# Patient Record
Sex: Female | Born: 1970 | Race: White | Hispanic: No | Marital: Married | Smoking: Former smoker
Health system: Southern US, Community
[De-identification: ages and names within clinical notes are randomized; demographics above are authoritative.]

## PROBLEM LIST (undated history)

## (undated) DIAGNOSIS — R109 Unspecified abdominal pain: Secondary | ICD-10-CM

## (undated) DIAGNOSIS — D649 Anemia, unspecified: Secondary | ICD-10-CM

## (undated) DIAGNOSIS — T7840XA Allergy, unspecified, initial encounter: Secondary | ICD-10-CM

## (undated) DIAGNOSIS — J45909 Unspecified asthma, uncomplicated: Secondary | ICD-10-CM

## (undated) HISTORY — PX: SHOULDER SURGERY: SHX246

## (undated) HISTORY — DX: Allergy, unspecified, initial encounter: T78.40XA

## (undated) HISTORY — DX: Unspecified asthma, uncomplicated: J45.909

## (undated) HISTORY — PX: ABDOMINAL HYSTERECTOMY: SHX81

## (undated) HISTORY — DX: Anemia, unspecified: D64.9

## (undated) HISTORY — PX: TUBAL LIGATION: SHX77

## (undated) HISTORY — DX: Unspecified abdominal pain: R10.9

---

## 1997-11-05 ENCOUNTER — Other Ambulatory Visit: Admission: RE | Admit: 1997-11-05 | Discharge: 1997-11-05 | Payer: Self-pay | Admitting: Obstetrics and Gynecology

## 1998-05-22 ENCOUNTER — Other Ambulatory Visit: Admission: RE | Admit: 1998-05-22 | Discharge: 1998-05-22 | Payer: Self-pay | Admitting: Obstetrics and Gynecology

## 1998-10-06 ENCOUNTER — Other Ambulatory Visit: Admission: RE | Admit: 1998-10-06 | Discharge: 1998-10-06 | Payer: Self-pay | Admitting: Obstetrics and Gynecology

## 1999-12-06 ENCOUNTER — Other Ambulatory Visit: Admission: RE | Admit: 1999-12-06 | Discharge: 1999-12-06 | Payer: Self-pay | Admitting: Obstetrics and Gynecology

## 2000-07-06 ENCOUNTER — Inpatient Hospital Stay (HOSPITAL_COMMUNITY): Admission: AD | Admit: 2000-07-06 | Discharge: 2000-07-08 | Payer: Self-pay | Admitting: Obstetrics and Gynecology

## 2000-08-10 ENCOUNTER — Other Ambulatory Visit: Admission: RE | Admit: 2000-08-10 | Discharge: 2000-08-10 | Payer: Self-pay | Admitting: Obstetrics and Gynecology

## 2001-08-24 ENCOUNTER — Other Ambulatory Visit: Admission: RE | Admit: 2001-08-24 | Discharge: 2001-08-24 | Payer: Self-pay | Admitting: Obstetrics and Gynecology

## 2002-07-05 ENCOUNTER — Encounter: Admission: RE | Admit: 2002-07-05 | Discharge: 2002-07-05 | Payer: Self-pay | Admitting: Otolaryngology

## 2002-07-05 ENCOUNTER — Encounter: Payer: Self-pay | Admitting: Otolaryngology

## 2002-11-14 ENCOUNTER — Other Ambulatory Visit: Admission: RE | Admit: 2002-11-14 | Discharge: 2002-11-14 | Payer: Self-pay | Admitting: Obstetrics and Gynecology

## 2003-12-03 ENCOUNTER — Other Ambulatory Visit: Admission: RE | Admit: 2003-12-03 | Discharge: 2003-12-03 | Payer: Self-pay | Admitting: Obstetrics and Gynecology

## 2004-02-20 ENCOUNTER — Ambulatory Visit (HOSPITAL_COMMUNITY): Admission: RE | Admit: 2004-02-20 | Discharge: 2004-02-20 | Payer: Self-pay | Admitting: Obstetrics and Gynecology

## 2012-11-14 ENCOUNTER — Encounter (HOSPITAL_COMMUNITY): Payer: Self-pay | Admitting: *Deleted

## 2012-11-14 ENCOUNTER — Emergency Department (HOSPITAL_COMMUNITY)
Admission: EM | Admit: 2012-11-14 | Discharge: 2012-11-14 | Disposition: A | Discharge status: Home / Self Care | Payer: BC Managed Care – PPO | Source: Home / Self Care | Attending: Emergency Medicine | Admitting: Emergency Medicine

## 2012-11-14 DIAGNOSIS — R52 Pain, unspecified: Secondary | ICD-10-CM

## 2012-11-14 DIAGNOSIS — M545 Low back pain: Secondary | ICD-10-CM

## 2012-11-14 DIAGNOSIS — G8929 Other chronic pain: Secondary | ICD-10-CM

## 2012-11-14 MED ORDER — CYCLOBENZAPRINE HCL 10 MG PO TABS: 10.0000 mg | ORAL_TABLET | Freq: Three times a day (TID) | ORAL | Status: DC | PRN

## 2012-11-14 MED ORDER — LORAZEPAM 2 MG/ML IJ SOLN
1.0000 mg | Freq: Once | INTRAMUSCULAR | Status: AC
  Administered 2012-11-14: 1 mg via INTRAMUSCULAR

## 2012-11-14 MED ORDER — TRAMADOL HCL 50 MG PO TABS: 50.0000 mg | ORAL_TABLET | Freq: Four times a day (QID) | ORAL | Status: DC | PRN

## 2012-11-14 MED ORDER — LORAZEPAM 2 MG/ML IJ SOLN
INTRAMUSCULAR | Status: AC
  Filled 2012-11-14: qty 1

## 2012-11-14 MED ORDER — KETOROLAC TROMETHAMINE 60 MG/2ML IM SOLN
60.0000 mg | Freq: Once | INTRAMUSCULAR | Status: AC
  Administered 2012-11-14: 60 mg via INTRAMUSCULAR

## 2012-11-14 MED ORDER — KETOROLAC TROMETHAMINE 60 MG/2ML IM SOLN
INTRAMUSCULAR | Status: AC
  Filled 2012-11-14: qty 2

## 2012-11-14 NOTE — ED Notes (Signed)
Pt      Reports           Low  Back pain  Described  As  A  Spasm          Seen     By  Chiropractor  yest             And  Was  On the  Way  Today  For  A  Repeat  Visit  When the pain    Became  Unbearable  -  She  Reports  Unable  To  Sit  Down       She  denys  Any   Recent  specefic injury      denys  Any  Urinary  Symptoms

## 2012-11-14 NOTE — ED Provider Notes (Signed)
History     CSN: [id number]  Arrival date & time 11/14/12  1128   First MD Initiated Contact with Patient 11/14/12 1345      Chief Complaint  Patient presents with  . Back Pain    (Consider location/radiation/quality/duration/timing/severity/associated sxs/prior treatment) Patient is a 42 y.o. female presenting with back pain. The history is provided by the patient. No language interpreter was used.  Back Pain Location:  Lumbar spine Quality:  Cramping Radiates to:  Does not radiate Pain severity:  Moderate Pain is:  Same all the time Onset quality:  Gradual Timing:  Constant Progression:  Unchanged Chronicity:  Chronic Relieved by:  Nothing Worsened by:  Movement Associated symptoms: no bladder incontinence, no bowel incontinence, no leg pain, no numbness, no paresthesias, no perianal numbness and no weakness   Risk factors comment:  DEGENERATIVE LUMBAR DISC DISEASE   History reviewed. No pertinent past medical history.  History reviewed. No pertinent past surgical history.  No family history on file.  History  Substance Use Topics  . Smoking status: Not on file  . Smokeless tobacco: Not on file  . Alcohol Use: Not on file    OB History   Grav Para Term Preterm Abortions TAB SAB Ect Mult Living                  Review of Systems  Constitutional: Negative.   Gastrointestinal: Negative for bowel incontinence.  Genitourinary: Negative for bladder incontinence.  Musculoskeletal: Positive for back pain.       C/O LOW BACK SPASMS  Neurological: Negative for weakness, numbness and paresthesias.  Psychiatric/Behavioral: Negative.   All other systems reviewed and are negative.    Allergies  Sulfa antibiotics  Home Medications   Current Outpatient Rx  Name  Route  Sig  Dispense  Refill  . cyclobenzaprine (FLEXERIL) 10 MG tablet   Oral   Take 1 tablet (10 mg total) by mouth 3 (three) times daily as needed for muscle spasms.   20 tablet   0   .  traMADol (ULTRAM) 50 MG tablet   Oral   Take 1 tablet (50 mg total) by mouth every 6 (six) hours as needed for pain.   15 tablet   0     BP 148/90  Pulse 115  Temp(Src) 98.4 F (36.9 C) (Oral)  Resp 18  SpO2 99%  Physical Exam  Constitutional: She appears well-developed and well-nourished.  HENT:  Head: Normocephalic and atraumatic.  Eyes: Pupils are equal, round, and reactive to light.  Neck: Normal range of motion.  Cardiovascular: Normal rate and regular rhythm.   Pulmonary/Chest: Effort normal and breath sounds normal.  Abdominal: Soft. Bowel sounds are normal.  Musculoskeletal:  LOW BACK AREA SHOWED NO ERYTHEMA, SWELLING OR DEFORMITY , NO TENDERNESS UNABLE TO PERFORM STRAIGHT LEG EXAM DUE TO BACK SPASMS     ED Course  Procedures (including critical care time)  Labs Reviewed - No data to display No results found.   1. Acute exacerbation of chronic low back pain       MDM          Jani Files, MD 11/15/12 2025

## 2012-11-29 ENCOUNTER — Other Ambulatory Visit: Payer: Self-pay | Admitting: Neurosurgery

## 2012-11-29 DIAGNOSIS — M5416 Radiculopathy, lumbar region: Secondary | ICD-10-CM

## 2012-12-13 ENCOUNTER — Ambulatory Visit
Admission: RE | Admit: 2012-12-13 | Discharge: 2012-12-13 | Disposition: A | Discharge status: Home / Self Care | Payer: BC Managed Care – PPO | Source: Ambulatory Visit | Attending: Neurosurgery | Admitting: Neurosurgery

## 2012-12-13 DIAGNOSIS — M5416 Radiculopathy, lumbar region: Secondary | ICD-10-CM

## 2017-02-28 DIAGNOSIS — M222X2 Patellofemoral disorders, left knee: Secondary | ICD-10-CM | POA: Insufficient documentation

## 2017-02-28 DIAGNOSIS — M25562 Pain in left knee: Secondary | ICD-10-CM | POA: Insufficient documentation

## 2017-04-25 DIAGNOSIS — M25512 Pain in left shoulder: Secondary | ICD-10-CM | POA: Insufficient documentation

## 2017-05-17 ENCOUNTER — Ambulatory Visit (INDEPENDENT_AMBULATORY_CARE_PROVIDER_SITE_OTHER): Payer: BLUE CROSS/BLUE SHIELD | Admitting: Physician Assistant

## 2017-05-17 ENCOUNTER — Encounter: Payer: Self-pay | Admitting: Physician Assistant

## 2017-05-17 VITALS — BP 112/80 | HR 73 | Temp 98.4°F | Resp 16 | Ht 68.75 in | Wt 167.8 lb

## 2017-05-17 DIAGNOSIS — H6981 Other specified disorders of Eustachian tube, right ear: Secondary | ICD-10-CM | POA: Diagnosis not present

## 2017-05-17 DIAGNOSIS — H9201 Otalgia, right ear: Secondary | ICD-10-CM

## 2017-05-17 DIAGNOSIS — H6991 Unspecified Eustachian tube disorder, right ear: Secondary | ICD-10-CM

## 2017-05-17 NOTE — Progress Notes (Signed)
   Shaune PascalKelly Pepitone  MRN: [id number] DOB: 1970/12/25  PCP: Patient, No Pcp Per  Subjective:  Pt is a pleasant 46 year old female who presents to clinic for right ear ache x 2 days.  Endorses intermittent right ear pain. Pain is "shooting". Feels better when she pulls it back a little and it "pops". Endorses some ringing in her ears. She has not taken anything.   Denies drainage, decreased hearing, fever, chills, cough. She had a cold a few weeks ago.   Review of Systems  Constitutional: Negative for chills and fever.  HENT: Positive for ear pain and tinnitus. Negative for congestion, ear discharge, rhinorrhea and sinus pain.   Respiratory: Negative for cough.     There are no active problems to display for this patient.   Current Outpatient Prescriptions on File Prior to Visit  Medication Sig Dispense Refill  . cyclobenzaprine (FLEXERIL) 10 MG tablet Take 1 tablet (10 mg total) by mouth 3 (three) times daily as needed for muscle spasms. (Patient not taking: Reported on 05/17/2017) 20 tablet 0  . traMADol (ULTRAM) 50 MG tablet Take 1 tablet (50 mg total) by mouth every 6 (six) hours as needed for pain. (Patient not taking: Reported on 05/17/2017) 15 tablet 0   No current facility-administered medications on file prior to visit.     Allergies  Allergen Reactions  . Penicillins     rash  . Sulfa Antibiotics      Objective:  BP 112/80   Pulse 73   Temp 98.4 F (36.9 C) (Oral)   Resp 16   Ht 5' 8.75" (1.746 m)   Wt 167 lb 12.8 oz (76.1 kg)   LMP 04/29/2017   SpO2 97%   BMI 24.96 kg/m   Physical Exam  Constitutional: She is oriented to person, place, and time and well-developed, well-nourished, and in no distress. No distress.  HENT:  Right Ear: No tenderness. No mastoid tenderness. Tympanic membrane is bulging. No middle ear effusion.  Left Ear: Tympanic membrane normal. No tenderness. No mastoid tenderness.  No middle ear effusion.  Mouth/Throat: Oropharynx is clear and  moist and mucous membranes are normal.  Neurological: She is alert and oriented to person, place, and time. GCS score is 15.  Skin: Skin is warm and dry.  Psychiatric: Mood, memory, affect and judgment normal.  Vitals reviewed.   Assessment and Plan :  1. Dysfunction of right eustachian tube 2. Right ear pain - Suspect pain 2/2 dysfunction of eustachian tube. Advised claritin-D and flonase. RTC if no improvement in 5-7 days.    Marco CollieWhitney Papa [place], PA-C  [facility] 05/17/2017 11:21 AM

## 2017-05-17 NOTE — Patient Instructions (Signed)
     IF you received an x-ray today, you will receive an invoice from [facility]. Please contact [facility] at [phone] with questions or concerns regarding your invoice.   IF you received labwork today, you will receive an invoice from [facility]. Please contact [facility] at [phone] with questions or concerns regarding your invoice.   Our billing staff will not be able to assist you with questions regarding bills from these companies.  You will be contacted with the lab results as soon as they are available. The fastest way to get your results is to activate your My Chart account. Instructions are located on the last page of this paperwork. If you have not heard from us regarding the results in 2 weeks, please contact this office.     

## 2017-06-01 ENCOUNTER — Other Ambulatory Visit: Payer: Self-pay | Admitting: Orthopedic Surgery

## 2017-06-01 DIAGNOSIS — M25512 Pain in left shoulder: Secondary | ICD-10-CM

## 2017-06-13 ENCOUNTER — Ambulatory Visit
Admission: RE | Admit: 2017-06-13 | Discharge: 2017-06-13 | Disposition: A | Discharge status: Home / Self Care | Payer: BLUE CROSS/BLUE SHIELD | Source: Ambulatory Visit | Attending: Orthopedic Surgery | Admitting: Orthopedic Surgery

## 2017-06-13 DIAGNOSIS — M25512 Pain in left shoulder: Secondary | ICD-10-CM

## 2017-07-04 DIAGNOSIS — Z9889 Other specified postprocedural states: Secondary | ICD-10-CM | POA: Insufficient documentation

## 2017-08-07 ENCOUNTER — Other Ambulatory Visit (HOSPITAL_COMMUNITY): Payer: Self-pay | Admitting: *Deleted

## 2017-08-08 ENCOUNTER — Encounter (HOSPITAL_COMMUNITY)
Admission: RE | Admit: 2017-08-08 | Discharge: 2017-08-08 | Disposition: A | Discharge status: Home / Self Care | Payer: BLUE CROSS/BLUE SHIELD | Source: Ambulatory Visit | Attending: Obstetrics and Gynecology | Admitting: Obstetrics and Gynecology

## 2017-08-08 DIAGNOSIS — D649 Anemia, unspecified: Secondary | ICD-10-CM | POA: Insufficient documentation

## 2017-08-08 MED ORDER — SODIUM CHLORIDE 0.9 % IV SOLN
510.0000 mg | INTRAVENOUS | Status: DC
  Administered 2017-08-08: 11:00:00 510 mg via INTRAVENOUS
  Filled 2017-08-08: qty 17

## 2017-08-08 NOTE — Discharge Instructions (Signed)

## 2017-08-14 ENCOUNTER — Encounter (HOSPITAL_COMMUNITY)
Admission: RE | Admit: 2017-08-14 | Discharge: 2017-08-14 | Disposition: A | Discharge status: Home / Self Care | Payer: BLUE CROSS/BLUE SHIELD | Source: Ambulatory Visit | Attending: Obstetrics and Gynecology | Admitting: Obstetrics and Gynecology

## 2017-08-14 DIAGNOSIS — D649 Anemia, unspecified: Secondary | ICD-10-CM | POA: Insufficient documentation

## 2017-08-14 MED ORDER — SODIUM CHLORIDE 0.9 % IV SOLN
510.0000 mg | INTRAVENOUS | Status: DC
  Administered 2017-08-14: 510 mg via INTRAVENOUS
  Filled 2017-08-14: qty 17

## 2017-11-25 ENCOUNTER — Other Ambulatory Visit: Payer: Self-pay

## 2017-11-25 ENCOUNTER — Emergency Department (HOSPITAL_COMMUNITY)
Admission: EM | Admit: 2017-11-25 | Discharge: 2017-11-25 | Disposition: A | Discharge status: Home / Self Care | Payer: BLUE CROSS/BLUE SHIELD | Attending: Emergency Medicine | Admitting: Emergency Medicine

## 2017-11-25 ENCOUNTER — Emergency Department (HOSPITAL_COMMUNITY): Payer: BLUE CROSS/BLUE SHIELD

## 2017-11-25 ENCOUNTER — Encounter (HOSPITAL_COMMUNITY): Payer: Self-pay | Admitting: *Deleted

## 2017-11-25 DIAGNOSIS — R110 Nausea: Secondary | ICD-10-CM | POA: Insufficient documentation

## 2017-11-25 DIAGNOSIS — R51 Headache: Secondary | ICD-10-CM | POA: Diagnosis present

## 2017-11-25 DIAGNOSIS — Z87891 Personal history of nicotine dependence: Secondary | ICD-10-CM | POA: Insufficient documentation

## 2017-11-25 DIAGNOSIS — I10 Essential (primary) hypertension: Secondary | ICD-10-CM | POA: Diagnosis not present

## 2017-11-25 DIAGNOSIS — R519 Headache, unspecified: Secondary | ICD-10-CM

## 2017-11-25 LAB — COMPREHENSIVE METABOLIC PANEL
ALK PHOS: 43 U/L (ref 38–126)
ALT: 18 U/L (ref 14–54)
AST: 27 U/L (ref 15–41)
Albumin: 4.2 g/dL (ref 3.5–5.0)
Anion gap: 8 (ref 5–15)
BILIRUBIN TOTAL: 0.7 mg/dL (ref 0.3–1.2)
BUN: 11 mg/dL (ref 6–20)
CALCIUM: 9.7 mg/dL (ref 8.9–10.3)
CO2: 26 mmol/L (ref 22–32)
Chloride: 106 mmol/L (ref 101–111)
Creatinine, Ser: 1.03 mg/dL — ABNORMAL HIGH (ref 0.44–1.00)
GFR calc Af Amer: >60 mL/min (ref >60)
GFR calc non Af Amer: >60 mL/min (ref >60)
GLUCOSE: 95 mg/dL (ref 65–99)
Potassium: 4.4 mmol/L (ref 3.5–5.1)
Sodium: 140 mmol/L (ref 135–145)
Total Protein: 7 g/dL (ref 6.5–8.1)

## 2017-11-25 LAB — CBC
HEMATOCRIT: 43 % (ref 36.0–46.0)
Hemoglobin: 14.2 g/dL (ref 12.0–15.0)
MCH: 31.8 pg (ref 26.0–34.0)
MCHC: 33 g/dL (ref 30.0–36.0)
MCV: 96.4 fL (ref 78.0–100.0)
Platelets: 248 10*3/uL (ref 150–400)
RBC: 4.46 MIL/uL (ref 3.87–5.11)
RDW: 13.5 % (ref 11.5–15.5)
WBC: 6.4 10*3/uL (ref 4.0–10.5)

## 2017-11-25 LAB — I-STAT CHEM 8, ED
BUN: 12 mg/dL (ref 6–20)
CHLORIDE: 105 mmol/L (ref 101–111)
CREATININE: 1 mg/dL (ref 0.44–1.00)
Calcium, Ion: 1.21 mmol/L (ref 1.15–1.40)
GLUCOSE: 95 mg/dL (ref 65–99)
HCT: 45 % (ref 36.0–46.0)
Hemoglobin: 15.3 g/dL — ABNORMAL HIGH (ref 12.0–15.0)
POTASSIUM: 4.9 mmol/L (ref 3.5–5.1)
Sodium: 141 mmol/L (ref 135–145)
TCO2: 25 mmol/L (ref 22–32)

## 2017-11-25 LAB — DIFFERENTIAL
BASOS PCT: 1 %
Basophils Absolute: 0.1 10*3/uL (ref 0.0–0.1)
EOS PCT: 3 %
Eosinophils Absolute: 0.2 10*3/uL (ref 0.0–0.7)
LYMPHS PCT: 40 %
Lymphs Abs: 2.6 10*3/uL (ref 0.7–4.0)
MONO ABS: 0.3 10*3/uL (ref 0.1–1.0)
Monocytes Relative: 4 %
Neutro Abs: 3.2 10*3/uL (ref 1.7–7.7)
Neutrophils Relative %: 52 %

## 2017-11-25 LAB — I-STAT BETA HCG BLOOD, ED (MC, WL, AP ONLY): I-stat hCG, quantitative: <5 m[IU]/mL (ref <5)

## 2017-11-25 LAB — PROTIME-INR
INR: 1.04
Prothrombin Time: 13.5 seconds (ref 11.4–15.2)

## 2017-11-25 LAB — APTT: aPTT: 31 seconds (ref 24–36)

## 2017-11-25 LAB — I-STAT TROPONIN, ED: Troponin i, poc: 0 ng/mL (ref 0.00–0.08)

## 2017-11-25 MED ORDER — METOCLOPRAMIDE HCL 5 MG/ML IJ SOLN
10.0000 mg | Freq: Once | INTRAMUSCULAR | Status: AC
  Administered 2017-11-25: 10 mg via INTRAVENOUS
  Filled 2017-11-25: qty 2

## 2017-11-25 MED ORDER — SODIUM CHLORIDE 0.9 % IV SOLN: INTRAVENOUS | Status: DC

## 2017-11-25 MED ORDER — KETOROLAC TROMETHAMINE 10 MG PO TABS: 10.0000 mg | ORAL_TABLET | Freq: Four times a day (QID) | ORAL | 0 refills | Status: DC | PRN

## 2017-11-25 MED ORDER — DIPHENHYDRAMINE HCL 50 MG/ML IJ SOLN
12.5000 mg | Freq: Once | INTRAMUSCULAR | Status: AC
  Administered 2017-11-25: 12.5 mg via INTRAVENOUS
  Filled 2017-11-25: qty 1

## 2017-11-25 MED ORDER — ONDANSETRON 4 MG PO TBDP: 4.0000 mg | ORAL_TABLET | Freq: Three times a day (TID) | ORAL | 0 refills | Status: DC | PRN

## 2017-11-25 MED ORDER — SODIUM CHLORIDE 0.9 % IV BOLUS
1000.0000 mL | Freq: Once | INTRAVENOUS | Status: AC
  Administered 2017-11-25: 1000 mL via INTRAVENOUS

## 2017-11-25 MED ORDER — KETOROLAC TROMETHAMINE 15 MG/ML IJ SOLN
15.0000 mg | Freq: Once | INTRAMUSCULAR | Status: AC
  Administered 2017-11-25: 15 mg via INTRAVENOUS
  Filled 2017-11-25: qty 1

## 2017-11-25 NOTE — ED Triage Notes (Addendum)
Pt is here with a headache for the last 2 weeks and states when her blood pressure spikes she gets an immediate headache with nausea.  No history of migraines.  She also states she blew a blood vessel in her right eye.  No change in vision.  Pt states when the pain first hits the pain is so bad she could rip her head off. Pt states pain feels like a vice and comes up off the back of her neck and radiates up.  Now it is in her temples.  Pupils 3RRB.

## 2017-11-25 NOTE — ED Provider Notes (Signed)
[facility] EMERGENCY DEPARTMENT Provider Note   CSN: [id number] Arrival date & time: 11/25/17  6213     History   Chief Complaint Chief Complaint  Patient presents with  . Headache    HPI Michelle Dorsey is a 47 y.o. female.  Pt presents to the ED today with a headache for the past 2 weeks.  She said she started developing a spontaneous severe headache out of the blue 2 weeks ago.  It will spike and cause severe and intense pain.  It is also associated with an elevated bp. Pt said ha will eventually go away, but it takes awhile and never fully goes away.  It is coming back more frequently.  She said she gets nauseous as well.  She has never had migraines or htn.  She did have a hysterectomy about 6 weeks ago, but her ovaries are still in place.     Past Medical History:  Diagnosis Date  . Anemia     Patient Active Problem List   Diagnosis Date Noted  . 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 ABDOMINAL HYSTERECTOMY    . SHOULDER SURGERY    . TUBAL LIGATION       OB History   None      Home Medications    Prior to Admission medications   Medication Sig Start Date End Date Taking? Authorizing Provider  acetaminophen (TYLENOL) 500 MG tablet Take 500 mg by mouth every 6 (six) hours as needed for headache.   Yes [provider]  ibuprofen (ADVIL,MOTRIN) 200 MG tablet Take 200 mg by mouth every 6 (six) hours as needed for headache.   Yes [provider]  cyclobenzaprine (FLEXERIL) 10 MG tablet Take 1 tablet (10 mg total) by mouth 3 (three) times daily as needed for muscle spasms. Patient not taking: Reported on 05/17/2017 11/14/12   [place], Duwayne Heck, MD  ketorolac (TORADOL) 10 MG tablet Take 1 tablet (10 mg total) by mouth every 6 (six) hours as needed. 11/25/17   Jacalyn Lefevre, MD  ondansetron (ZOFRAN ODT) 4 MG disintegrating tablet Take 1 tablet (4 mg total)  by mouth every 8 (eight) hours as needed. 11/25/17   Jacalyn Lefevre, MD  traMADol (ULTRAM) 50 MG tablet Take 1 tablet (50 mg total) by mouth every 6 (six) hours as needed for pain. Patient not taking: Reported on 05/17/2017 11/14/12   [place], Duwayne Heck, MD    Family History Family History  Problem Relation Age of Onset  . Heart disease Mother   . Cancer Maternal Grandfather        bladder  . Cancer Paternal Grandfather        Lung    Social History Social History   Tobacco Use  . Smoking status: Former Games developer  . Smokeless tobacco: Never Used  Substance Use Topics  . Alcohol use: No  . Drug use: No     Allergies   Penicillins and Sulfa antibiotics   Review of Systems Review of Systems  Gastrointestinal: Positive for nausea.  Neurological: Positive for headaches.  All other systems reviewed and are negative.    Physical Exam Updated Vital Signs BP (!) 148/99   Pulse 79   Resp (!) 21   Ht  (1.753 m)   Wt 76.7 kg (169 lb)   LMP 04/29/2017   SpO2 99%   BMI 24.96 kg/m  Physical Exam  Constitutional: She is oriented to person, place, and time. She appears well-developed and well-nourished.  HENT:  Head: Normocephalic and atraumatic.  Mouth/Throat: Oropharynx is clear and moist.  Eyes: Pupils are equal, round, and reactive to light. EOM are normal.  Neck: Normal range of motion. Neck supple.  Cardiovascular: Normal rate, regular rhythm, normal heart sounds and intact distal pulses.  Pulmonary/Chest: Effort normal and breath sounds normal.  Abdominal: Soft. Bowel sounds are normal.  Musculoskeletal: Normal range of motion.  Neurological: She is alert and oriented to person, place, and time. She has normal strength. She displays a negative Romberg sign.  Skin: Skin is warm and dry. Capillary refill takes less than 2 seconds.  Psychiatric: She has a normal mood and affect. Her behavior is normal.  Nursing note and vitals reviewed.    ED Treatments /  Results  Labs (all labs ordered are listed, but only abnormal results are displayed) Labs Reviewed  COMPREHENSIVE METABOLIC PANEL - Abnormal; Notable for the following components:      Result Value   Creatinine, Ser 1.03 (*)    All other components within normal limits  I-STAT CHEM 8, ED - Abnormal; Notable for the following components:   Hemoglobin 15.3 (*)    All other components within normal limits  PROTIME-INR  APTT  CBC  DIFFERENTIAL  I-STAT TROPONIN, ED  CBG MONITORING, ED  I-STAT BETA HCG BLOOD, ED ([facility], [facility], [facility] ONLY)    EKG EKG Interpretation  Date/Time:  Saturday Nov 25 2017 12:11:28 EDT Ventricular Rate:  59 PR Interval:    QRS Duration: 106 QT Interval:  441 QTC Calculation: 437 R Axis:   70 Text Interpretation:  Sinus rhythm Baseline wander in lead(s) V1 V2 Confirmed by Jacalyn Lefevre [phone]) on 11/25/2017 12:28:55 PM   Radiology Ct Head Wo Contrast  Result Date: 11/25/2017 CLINICAL DATA:  Intermittent headache for the past 2 weeks, severe last evening EXAM: CT HEAD WITHOUT CONTRAST TECHNIQUE: Contiguous axial images were obtained from the base of the skull through the vertex without intravenous contrast. COMPARISON:  None. FINDINGS: Brain: Gray-white differentiation is maintained. No CT evidence of acute large territory infarct. No intraparenchymal or extra-axial mass or hemorrhage. Normal size and configuration of the ventricles and the basilar cisterns. No midline shift. Vascular: No hyperdense vessel or unexpected calcification. Skull: No displaced calvarial fracture. Sinuses/Orbits: Circumferential mucosal thickening involving the caudal aspects of the bilateral maxillary sinuses, right greater than left. Remaining paranasal sinuses and mastoid air cells appear normally aerated. No air-fluid levels. Other: Regional soft tissues appear normal. IMPRESSION: Negative noncontrast head CT. Electronically Signed   By: Simonne Come M.D.   On: 11/25/2017 11:32     Procedures Procedures (including critical care time)  Medications Ordered in ED Medications  sodium chloride 0.9 % bolus 1,000 mL (1,000 mLs Intravenous New Bag/Given 11/25/17 1235)    And  0.9 %  sodium chloride infusion (has no administration in time range)  ketorolac (TORADOL) 15 MG/ML injection 15 mg (15 mg Intravenous Given 11/25/17 1232)  metoCLOPramide (REGLAN) injection 10 mg (10 mg Intravenous Given 11/25/17 1234)  diphenhydrAMINE (BENADRYL) injection 12.5 mg (12.5 mg Intravenous Given 11/25/17 1233)     Initial Impression / Assessment and Plan / ED Course  I have reviewed the triage vital signs and the nursing notes.  Pertinent labs & imaging results that were available during my care of the patient were reviewed by me and considered in my medical decision making (see chart for  details).    Pt is feeling much better.  She looks much more comfortable.  BP is trending down with pain relief.  She will be d/c home with instructions to f/u with neurology.  Return if worse.  Final Clinical Impressions(s) / ED Diagnoses   Final diagnoses:  Headache disorder    ED Discharge Orders        Ordered    ketorolac (TORADOL) 10 MG tablet  Every 6 hours PRN     11/25/17 1424    ondansetron (ZOFRAN ODT) 4 MG disintegrating tablet  Every 8 hours PRN     11/25/17 1424       Jacalyn Lefevre, MD 11/25/17 1426

## 2017-12-15 ENCOUNTER — Ambulatory Visit: Payer: BLUE CROSS/BLUE SHIELD | Admitting: Neurology

## 2017-12-15 ENCOUNTER — Encounter: Payer: Self-pay | Admitting: Neurology

## 2017-12-15 VITALS — BP 118/81 | HR 68 | Ht 69.0 in | Wt 169.0 lb

## 2017-12-15 DIAGNOSIS — R519 Headache, unspecified: Secondary | ICD-10-CM

## 2017-12-15 DIAGNOSIS — G43009 Migraine without aura, not intractable, without status migrainosus: Secondary | ICD-10-CM | POA: Diagnosis not present

## 2017-12-15 DIAGNOSIS — R51 Headache: Secondary | ICD-10-CM

## 2017-12-15 DIAGNOSIS — R1033 Periumbilical pain: Secondary | ICD-10-CM | POA: Diagnosis not present

## 2017-12-15 DIAGNOSIS — I609 Nontraumatic subarachnoid hemorrhage, unspecified: Secondary | ICD-10-CM

## 2017-12-15 DIAGNOSIS — G4484 Primary exertional headache: Secondary | ICD-10-CM

## 2017-12-15 DIAGNOSIS — Q283 Other malformations of cerebral vessels: Secondary | ICD-10-CM | POA: Diagnosis not present

## 2017-12-15 DIAGNOSIS — H44811 Hemophthalmos, right eye: Secondary | ICD-10-CM

## 2017-12-15 DIAGNOSIS — G4453 Primary thunderclap headache: Secondary | ICD-10-CM

## 2017-12-15 MED ORDER — KETOROLAC TROMETHAMINE 10 MG PO TABS: 10.0000 mg | ORAL_TABLET | Freq: Four times a day (QID) | ORAL | 11 refills | Status: DC | PRN

## 2017-12-15 NOTE — Progress Notes (Signed)
[facility]    Provider:  Dr Lucia Gaskins Referring Provider: [facility] Dr. Harold Hedge Primary Care Physician:  [facility] Dr. Harold Hedge  CC:  Headaches  HPI:  Michelle Dorsey is a 47 y.o. female here as a referral from Dr. No ref. provider found for headaches. She has no significant history, no history of migraines. She has abdominal pain, can last hours, helps to defecate, causes her blood pressure to increase. She has "stomach attacks" and not over until she emties her bowels. Right before she orgasms she has a headache it comes and goes. With stress she has the abdominal pain and "stomach attacks" and her blood presure "goes trhough the roof" and that is when she has the headache. Don;t always have a headache with the abdominal pain only when severe and her blood pressure increases. She has never been to GI. Ketorolac works great for the abdominal pain and the headache. The headache is all over, pounding, nausea.No other focal neurologic deficits, associated symptoms, inciting events or modifiable factors.  Reviewed notes, labs and imaging from outside physicians, which showed:  BUN 12, creatinine 1  CT head showed No acute intracranial abnormalities including mass lesion or mass effect, hydrocephalus, extra-axial fluid collection, midline shift, hemorrhage, or acute infarction, large ischemic events (personally reviewed images)   Review of Systems: Patient complains of symptoms per HPI as well as the following symptoms: headache, abdominal pain. Pertinent negatives and positives per HPI. All others negative.   Social History   Socioeconomic History  . Marital status: Single    Spouse name: Not on file  . Number of children: Not on file  . Years of education: Not on file  . Highest education level: Not on file  Occupational History  . Not on file  Social Needs  . Financial resource strain: Not on file  . Food insecurity:    Worry: Not on  file    Inability: Not on file  . Transportation needs:    Medical: Not on file    Non-medical: Not on file  Tobacco Use  . Smoking status: Former Smoker    Last attempt to quit: 2014    Years since quitting: 5.4  . Smokeless tobacco: Never Used  Substance and Sexual Activity  . Alcohol use: No  . Drug use: No  . Sexual activity: Not on file  Lifestyle  . Physical activity:    Days per week: Not on file    Minutes per session: Not on file  . Stress: Not on file  Relationships  . Social connections:    Talks on phone: Not on file    Gets together: Not on file    Attends religious service: Not on file    Active member of club or organization: Not on file    Attends meetings of clubs or organizations: Not on file    Relationship status: Not on file  . Intimate partner violence:    Fear of current or ex partner: Not on file    Emotionally abused: Not on file    Physically abused: Not on file    Forced sexual activity: Not on file  Other Topics Concern  . Not on file  Social History Narrative  . Not on file    Family History  Problem Relation Age of Onset  . Heart disease Mother   . Recurrent abdominal pain Mother   . Cancer Maternal Grandfather        bladder  .  Cancer Paternal Grandfather        Lung    Past Medical History:  Diagnosis Date  . Abdominal pain   . Anemia     Past Surgical History:  Procedure Laterality Date  . ABDOMINAL HYSTERECTOMY    . SHOULDER SURGERY    . TUBAL LIGATION      Current Outpatient Medications  Medication Sig Dispense Refill  . ketorolac (TORADOL) 10 MG tablet Take 1 tablet (10 mg total) by mouth every 6 (six) hours as needed for severe pain. 30 tablet 11   No current facility-administered medications for this visit.     Allergies as of 12/15/2017 - Review Complete 12/15/2017  Allergen Reaction Noted  . Penicillins  05/17/2017  . Sulfa antibiotics  11/14/2012    Vitals: BP 118/81   Pulse 68   Ht  (1.753 m)    Wt 169 lb (76.7 kg)   LMP 04/29/2017   BMI 24.96 kg/m  Last Weight:  Wt Readings from Last 1 Encounters:  12/15/17 169 lb (76.7 kg)   Last Height:   Ht Readings from Last 1 Encounters:  12/15/17  (1.753 m)   Physical exam: Exam: Gen: NAD, conversant, well nourised, obese, well groomed                     CV: RRR, no MRG. No Carotid Bruits. No peripheral edema, warm, nontender Eyes: Conjunctivae clear without exudates or hemorrhage  Neuro: Detailed Neurologic Exam  Speech:    Speech is normal; fluent and spontaneous with normal comprehension.  Cognition:    The patient is oriented to person, place, and time;     recent and remote memory intact;     language fluent;     normal attention, concentration,     fund of knowledge Cranial Nerves:    The pupils are equal, round, and reactive to light. The fundi are normal and spontaneous venous pulsations are present. Visual fields are full to finger confrontation. Extraocular movements are intact. Trigeminal sensation is intact and the muscles of mastication are normal. The face is symmetric. The palate elevates in the midline. Hearing intact. Voice is normal. Shoulder shrug is normal. The tongue has normal motion without fasciculations.   Coordination:    Normal finger to nose and heel to shin. Normal rapid alternating movements.   Gait:    Heel-toe and tandem gait are normal.   Motor Observation:    No asymmetry, no atrophy, and no involuntary movements noted. Tone:    Normal muscle tone.    Posture:    Posture is normal. normal erect    Strength:    Strength is V/V in the upper and lower limbs.      Sensation: intact to LT     Reflex Exam:  DTR's:    Deep tendon reflexes in the upper and lower extremities are normal bilaterally.   Toes:    The toes are downgoing bilaterally.   Clonus:    Clonus is absent.       Assessment/Plan:  Patient with explosive exertional headaches, worse headache in her life,  thunderclap. Likely exertional headaches and also with migraines and episodes of abdominal pain "excruciating"   [facility] Dr. Harold Hedge for regular care.   "abdominal attacks" severe and painful, incapacitating - will refer to GI. Could be migrainous or abdominal migraines but needs evaluation due to severity  Explosive exertional headaches: Need evaluation for subarachnoid hemorrhage, aneurysm, lesions in the  brain such as vascular malformations: MRI brain w/wo contrast and MRA head  Can try taking Ketorolac 30-60 minutes before sex. Can also try indomethacin in the future if needed or propranolol .   Ketorolac helps,   Orders Placed This Encounter  Procedures  . MR BRAIN W WO CONTRAST  . MR MRA HEAD WO CONTRAST  . Ambulatory referral to Gastroenterology   Cc: Dr. Josefa Half, MD  [facility] [address] [place], [place] [address]  Phone [phone] Fax [phone]

## 2017-12-15 NOTE — Patient Instructions (Signed)
Gastroenterology referral MRI of the brain and blood vessels Torolac as needed or 30-60 minutes before sex do not take more than 2-3x in one week  Migraine Headache A migraine headache is a very strong throbbing pain on one side or both sides of your head. Migraines can also cause other symptoms. Talk with your doctor about what things may bring on (trigger) your migraine headaches. Follow these instructions at home: Medicines  Take over-the-counter and prescription medicines only as told by your doctor.  Do not drive or use heavy machinery while taking prescription pain medicine.  To prevent or treat constipation while you are taking prescription pain medicine, your doctor may recommend that you: ? Drink enough fluid to keep your pee (urine) clear or pale yellow. ? Take over-the-counter or prescription medicines. ? Eat foods that are high in fiber. These include fresh fruits and vegetables, whole grains, and beans. ? Limit foods that are high in fat and processed sugars. These include fried and sweet foods. Lifestyle  Avoid alcohol.  Do not use any products that contain nicotine or tobacco, such as cigarettes and e-cigarettes. If you need help quitting, ask your doctor.  Get at least 8 hours of sleep every night.  Limit your stress. General instructions   Keep a journal to find out what may bring on your migraines. For example, write down: ? What you eat and drink. ? How much sleep you get. ? Any change in what you eat or drink. ? Any change in your medicines.  If you have a migraine: ? Avoid things that make your symptoms worse, such as bright lights. ? It may help to lie down in a dark, quiet room. ? Do not drive or use heavy machinery. ? Ask your doctor what activities are safe for you.  Keep all follow-up visits as told by your doctor. This is important. Contact a doctor if:  You get a migraine that is different or worse than your usual migraines. Get help right  away if:  Your migraine gets very bad.  You have a fever.  You have a stiff neck.  You have trouble seeing.  Your muscles feel weak or like you cannot control them.  You start to lose your balance a lot.  You start to have trouble walking.  You pass out (faint). This information is not intended to replace advice given to you by your health care provider. Make sure you discuss any questions you have with your health care provider. Document Released: 04/12/2008 Document Revised: 01/22/2016 Document Reviewed: 12/21/2015 Elsevier Interactive Patient Education  2018 [employer].

## 2017-12-18 ENCOUNTER — Telehealth: Payer: Self-pay | Admitting: Neurology

## 2017-12-18 NOTE — Telephone Encounter (Signed)
pt requested [facility] [employer] Auth: NPR Ref # [id number] order sent to GI. They will contact the pt to schedule.

## 2018-01-04 ENCOUNTER — Telehealth: Payer: Self-pay | Admitting: Neurology

## 2018-01-04 NOTE — Telephone Encounter (Signed)
I called [facility], spoke with Maureen RalphsVivian, pharmacist. The manufacturer of toradol does not recommend more than 20 tablets per 30 days. Also, refills are not recommended. Pt should receive IV or IM toradol injection immediately prior to dispensing toradol tablets as well. [facility] will not give her any more toradol tablets, and says that another NSAID will need to be prescribed. Dr. Lucia GaskinsAhern is out of the office. Will send WID for review.

## 2018-01-04 NOTE — Telephone Encounter (Signed)
I called pt and discuss Dr. Teofilo PodAthar's recommendations. Pt is agreeable to completing her MRI/MRA and asked that our office reach out to her to get those scheduled. Pt asked if another medication could be prescribed, similar to toradol, since the toradol helped her headaches. I advised pt that this would need to be addressed with Dr. Lucia GaskinsAhern, upon her return to the office, since Dr. Frances FurbishAthar does not recommend anything right now. Pt verbalized understanding. Will send to Dr. Lucia GaskinsAhern to review upon her return.  I called [facility], advised staff that pt's toradol RX should be placed on hold, they verbalized understanding.

## 2018-01-04 NOTE — Telephone Encounter (Signed)
Pt called to advise [facility] will not give her ketorolac (TORADOL) 10 MG tablet, cannot allow that many refills and that many pills at all and pt will need to have a steroid shot. She is asking for a call to be made to pharmacy for clarification.

## 2018-01-04 NOTE — Telephone Encounter (Signed)
I agree that ongoing use of Toradol is not recommended. I would suggest we await test results from the MRI and MRA, please look into when these are scheduled for. I would not recommend any other high-dose nonsteroidal pain medication especially in light of stomach pains and abdominal pain reported. She may need to see GI as recommended by Dr. Lucia GaskinsAhern. Please clarify with pharmacy that for now we will put the refills on the Toradol on hold. Please also notify patient of the above.

## 2018-01-09 ENCOUNTER — Other Ambulatory Visit: Payer: Self-pay | Admitting: Neurology

## 2018-01-09 NOTE — Telephone Encounter (Signed)
We discussed trying 10mg  propranol 30 minutes before sex. Let's wait until the imaging results and if negative we can try that. Indomethacin is also something we can try but I would want her to see GI for her reported severe stomach pain first thanks

## 2018-01-10 NOTE — Telephone Encounter (Signed)
Called pt & LVM (ok per DPR) with the information just provided by Dr. Lucia GaskinsAhern in response to her request for a different medication to try. Left office number for any questions.

## 2018-01-19 ENCOUNTER — Inpatient Hospital Stay: Admission: RE | Admit: 2018-01-19 | Payer: BLUE CROSS/BLUE SHIELD | Source: Ambulatory Visit

## 2018-01-19 ENCOUNTER — Other Ambulatory Visit: Payer: BLUE CROSS/BLUE SHIELD

## 2018-03-21 ENCOUNTER — Other Ambulatory Visit (INDEPENDENT_AMBULATORY_CARE_PROVIDER_SITE_OTHER): Payer: BLUE CROSS/BLUE SHIELD

## 2018-03-21 ENCOUNTER — Encounter (INDEPENDENT_AMBULATORY_CARE_PROVIDER_SITE_OTHER): Payer: Self-pay

## 2018-03-21 ENCOUNTER — Encounter: Payer: Self-pay | Admitting: Gastroenterology

## 2018-03-21 ENCOUNTER — Ambulatory Visit: Payer: BLUE CROSS/BLUE SHIELD | Admitting: Gastroenterology

## 2018-03-21 VITALS — BP 116/80 | HR 72 | Ht 69.0 in | Wt 159.4 lb

## 2018-03-21 DIAGNOSIS — R109 Unspecified abdominal pain: Secondary | ICD-10-CM

## 2018-03-21 LAB — SEDIMENTATION RATE: SED RATE: 14 mm/h (ref 0–20)

## 2018-03-21 LAB — IGA: IgA: 206 mg/dL (ref 68–378)

## 2018-03-21 MED ORDER — HYOSCYAMINE SULFATE 0.125 MG SL SUBL: SUBLINGUAL_TABLET | SUBLINGUAL | 2 refills | Status: DC

## 2018-03-21 NOTE — Progress Notes (Signed)
HPI: This is a  Very pleasant 47 yo woman  who was referred to me by Anson Fret, MD  to evaluate  Abdominal pains .    Chief complaint is chronic intermittent6 abd pains.   No gi symptoms in many months.  Previously excruciatig migraines; often onset by 'stomach attack' excruciating pains that cause migraines.  Sometimes onset by sex.  Her mother has stomach attacks.  Her daughter has stomach attacks.  She's read about abdominal migraines.  Has chronic interittent mpains. Start as sharp pains doubling her over, hot on face. The pain lasts 1-2 minutes. Then will have move her bowels eventually to watery stools.  Has neck, face redness during these events.  Less than 1/2 the time she will have a migraine afterwards.  Son has crohn's disease.  On humira.  Recall that never had imaging of her abdomen.  In between the events, her bowels are QOD solid, brown non-bloody without.  Has lost 15 pounds by intention (cut out several things). Has cut out a lot of starches.  Old Data Reviewed:     Review of systems: Pertinent positive and negative review of systems were noted in the above HPI section. All other review negative.   Past Medical History:  Diagnosis Date  . Abdominal pain   . Anemia     Past Surgical History:  Procedure Laterality Date  . ABDOMINAL HYSTERECTOMY    . SHOULDER SURGERY    . TUBAL LIGATION      No current outpatient medications on file.   No current facility-administered medications for this visit.     Allergies as of 03/21/2018 - Review Complete 03/21/2018  Allergen Reaction Noted  . Penicillins  05/17/2017  . Sulfa antibiotics  11/14/2012    Family History  Problem Relation Age of Onset  . Heart disease Mother   . Recurrent abdominal pain Mother   . Cancer Maternal Grandfather        bladder  . Cancer Paternal Grandfather        Lung    Social History   Socioeconomic History  . Marital status: Single    Spouse name:  Not on file  . Number of children: 2  . Years of education: Not on file  . Highest education level: Not on file  Occupational History  . Not on file  Social Needs  . Financial resource strain: Not on file  . Food insecurity:    Worry: Not on file    Inability: Not on file  . Transportation needs:    Medical: Not on file    Non-medical: Not on file  Tobacco Use  . Smoking status: Former Smoker    Last attempt to quit: 2014    Years since quitting: 5.6  . Smokeless tobacco: Never Used  Substance and Sexual Activity  . Alcohol use: No  . Drug use: No  . Sexual activity: Not on file  Lifestyle  . Physical activity:    Days per week: Not on file    Minutes per session: Not on file  . Stress: Not on file  Relationships  . Social connections:    Talks on phone: Not on file    Gets together: Not on file    Attends religious service: Not on file    Active member of club or organization: Not on file    Attends meetings of clubs or organizations: Not on file    Relationship status: Not on file  . Intimate  partner violence:    Fear of current or ex partner: Not on file    Emotionally abused: Not on file    Physically abused: Not on file    Forced sexual activity: Not on file  Other Topics Concern  . Not on file  Social History Narrative  . Not on file     Physical Exam: BP 116/80   Pulse 72   Ht 5\' 9"  (1.753 m)   Wt 159 lb 6 oz (72.3 kg)   LMP 04/29/2017   BMI 23.54 kg/m  Constitutional: generally well-appearing Psychiatric: alert and oriented x3 Eyes: extraocular movements intact Mouth: oral pharynx moist, no lesions Neck: supple no lymphadenopathy Cardiovascular: heart regular rate and rhythm Lungs: clear to auscultation bilaterally Abdomen: soft, nontender, nondistended, no obvious ascites, no peritoneal signs, normal bowel sounds Extremities: no lower extremity edema bilaterally Skin: no lesions on visible extremities   Assessment and plan: 47 y.o. female  with  Chronic intermittent abd pain,  No symptoms in several months.  Seems most likely IBS, which often but not always triggers a migraine. Want to rule out other possibilities (biliary colic, h. Pylori, celiac sprue).  See the blood test and stool tests and imaging studies have recommended below in patient instructions.  She will also be given a prescription for sublingual as needed antispasmodics.    Please see the "Patient Instructions" section for addition details about the plan.   Rob Bunting, MD [facility] 03/21/2018, 8:52 AM  Cc: Anson Fret, MD

## 2018-03-21 NOTE — Patient Instructions (Addendum)
You will be set up for an ultrasound for abdominal pain.  You will have labs checked today in the basement lab.  Please head down after you check out with the front desk  (total IgA level, tTG level, esr, h. Pylori stool ag).  Trial of antispasmodics, take SL PRN at outset of the attack.  levsin .180m take 1-2 pills prn abd pains, disp 50 with 2 refills.  [facility]/18 at 11:00. Please arrive 15 minutes prior to your appointment for registration. Make certain not to have anything to eat or drink 6 hours prior to your appointment. Should you need to reschedule your appointment, please contact radiology at [phone] This test typically takes about 30 minutes to perform.

## 2018-03-22 LAB — TISSUE TRANSGLUTAMINASE, IGA: (TTG) AB, IGA: 1 U/mL

## 2018-03-23 ENCOUNTER — Ambulatory Visit (HOSPITAL_COMMUNITY): Admission: RE | Admit: 2018-03-23 | Payer: BLUE CROSS/BLUE SHIELD | Source: Ambulatory Visit

## 2018-03-29 ENCOUNTER — Ambulatory Visit (HOSPITAL_COMMUNITY): Admission: RE | Admit: 2018-03-29 | Payer: BLUE CROSS/BLUE SHIELD | Source: Ambulatory Visit

## 2018-04-03 ENCOUNTER — Ambulatory Visit (HOSPITAL_COMMUNITY): Payer: BLUE CROSS/BLUE SHIELD

## 2019-07-15 ENCOUNTER — Ambulatory Visit: Payer: BC Managed Care – PPO | Attending: Internal Medicine

## 2019-07-15 DIAGNOSIS — Z20822 Contact with and (suspected) exposure to covid-19: Secondary | ICD-10-CM

## 2019-07-17 LAB — NOVEL CORONAVIRUS, NAA: SARS-CoV-2, NAA: DETECTED — AB

## 2020-04-30 ENCOUNTER — Encounter (INDEPENDENT_AMBULATORY_CARE_PROVIDER_SITE_OTHER): Payer: Self-pay | Admitting: Otolaryngology

## 2020-04-30 ENCOUNTER — Other Ambulatory Visit: Payer: Self-pay

## 2020-04-30 ENCOUNTER — Ambulatory Visit (INDEPENDENT_AMBULATORY_CARE_PROVIDER_SITE_OTHER): Payer: BC Managed Care – PPO | Admitting: Otolaryngology

## 2020-04-30 VITALS — Temp 97.2°F

## 2020-04-30 DIAGNOSIS — H9201 Otalgia, right ear: Secondary | ICD-10-CM

## 2020-04-30 DIAGNOSIS — H6121 Impacted cerumen, right ear: Secondary | ICD-10-CM | POA: Diagnosis not present

## 2020-04-30 NOTE — Progress Notes (Signed)
HPI: Michelle Dorsey is a 49 y.o. female who presents for evaluation of right ear discomfort.  She has had some intermittent sharp deep pain in the right ear that comes and goes.  She had to take Tylenol at one point.  She did a video call with her medical doctor who referred her to have this examined.  She has not had any hearing problems as she feels like she hears well.  She has had no drainage from the ear. She denies any sore throat.  Denies any sinus symptoms.  Past Medical History:  Diagnosis Date  . Abdominal pain   . Anemia    Past Surgical History:  Procedure Laterality Date  . ABDOMINAL HYSTERECTOMY    . SHOULDER SURGERY    . TUBAL LIGATION     Social History   Socioeconomic History  . Marital status: Married    Spouse name: Not on file  . Number of children: 2  . Years of education: Not on file  . Highest education level: Not on file  Occupational History  . Not on file  Tobacco Use  . Smoking status: Former Smoker    Quit date: 2014    Years since quitting: 7.7  . Smokeless tobacco: Never Used  Vaping Use  . Vaping Use: Never used  Substance and Sexual Activity  . Alcohol use: No  . Drug use: No  . Sexual activity: Not on file  Other Topics Concern  . Not on file  Social History Narrative  . Not on file   Social Determinants of Health   Financial Resource Strain:   . Difficulty of Paying Living Expenses: Not on file  Food Insecurity:   . Worried About Programme researcher, broadcasting/film/video in the Last Year: Not on file  . Ran Out of Food in the Last Year: Not on file  Transportation Needs:   . Lack of Transportation (Medical): Not on file  . Lack of Transportation (Non-Medical): Not on file  Physical Activity:   . Days of Exercise per Week: Not on file  . Minutes of Exercise per Session: Not on file  Stress:   . Feeling of Stress : Not on file  Social Connections:   . Frequency of Communication with Friends and Family: Not on file  . Frequency of Social Gatherings  with Friends and Family: Not on file  . Attends Religious Services: Not on file  . Active Member of Clubs or Organizations: Not on file  . Attends Banker Meetings: Not on file  . Marital Status: Not on file   Family History  Problem Relation Age of Onset  . Heart disease Mother   . Recurrent abdominal pain Mother   . Cancer Maternal Grandfather        bladder  . Cancer Paternal Grandfather        Lung   Allergies  Allergen Reactions  . Penicillins     rash  . Sulfa Antibiotics    Prior to Admission medications   Medication Sig Start Date End Date Taking? Authorizing Provider  hyoscyamine (LEVSIN SL) 0.125 MG SL tablet Take 1-2 pills as needed for abdominal pain 03/21/18   Rachael Fee, MD     Positive ROS: Otherwise negative  All other systems have been reviewed and were otherwise negative with the exception of those mentioned in the HPI and as above.  Physical Exam: Constitutional: Alert, well-appearing, no acute distress Ears: External ears without lesions or tenderness.  She had  minimal wax on the left side and a large ball of wax on the right side that was removed with suction.  The ear canal and TM were otherwise clear with normal mobility of the TM on pneumatic otoscopy.  The ear felt a little better after cleaning the ear canal..  There are no signs of infection. Nasal: External nose without lesions.. Clear nasal passages.  Septum midline she has had no nasal sinus symptoms.. Oral: Lips and gums without lesions. Tongue and palate mucosa without lesions. Posterior oropharynx clear. Neck: No palpable adenopathy or masses.  No pain or discomfort on palpation of the TMJ joints on either side. Respiratory: Breathing comfortably  Skin: No facial/neck lesions or rash noted.  Cerumen impaction removal  Date/Time: 04/30/2020 1:55 PM Performed by: Drema Halon, MD Authorized by: Drema Halon, MD   Consent:    Consent obtained:  Verbal    Consent given by:  Patient   Risks discussed:  Pain and bleeding Procedure details:    Location:  R ear   Procedure type: suction   Post-procedure details:    Inspection:  TM intact and canal normal   Hearing quality:  Improved   Patient tolerance of procedure:  Tolerated well, no immediate complications Comments:     Patient had large amount of wax on the right side that was cleaned with suction.  The ear canal and TM were otherwise clear.    Assessment: Suspect the ear discomfort on the right side was secondary to the wax buildup as ear canal and TM are  otherwise clear  Plan: She will follow-up as needed.  Narda Bonds, MD

## 2021-02-15 ENCOUNTER — Ambulatory Visit (INDEPENDENT_AMBULATORY_CARE_PROVIDER_SITE_OTHER): Payer: BC Managed Care – PPO

## 2021-02-15 ENCOUNTER — Other Ambulatory Visit: Payer: Self-pay | Admitting: Podiatry

## 2021-02-15 ENCOUNTER — Encounter: Payer: Self-pay | Admitting: Podiatry

## 2021-02-15 ENCOUNTER — Ambulatory Visit (INDEPENDENT_AMBULATORY_CARE_PROVIDER_SITE_OTHER): Payer: BC Managed Care – PPO | Admitting: Podiatry

## 2021-02-15 ENCOUNTER — Other Ambulatory Visit: Payer: Self-pay

## 2021-02-15 DIAGNOSIS — S92501A Displaced unspecified fracture of right lesser toe(s), initial encounter for closed fracture: Secondary | ICD-10-CM

## 2021-02-15 DIAGNOSIS — S92502A Displaced unspecified fracture of left lesser toe(s), initial encounter for closed fracture: Secondary | ICD-10-CM | POA: Diagnosis not present

## 2021-02-15 DIAGNOSIS — M778 Other enthesopathies, not elsewhere classified: Secondary | ICD-10-CM

## 2021-02-15 NOTE — Progress Notes (Signed)
  Subjective:  Patient ID: Michelle Dorsey, female    DOB: 1970/09/11,  MRN: [id number]  Chief Complaint  Patient presents with   Foot Pain      (np) swelling in right foot, possible fracture 5th toe     50 y.o. female presents with the above complaint. History confirmed with patient.  She caught the toe on a swing set while she was moving it.  This happened 3 weeks ago  Objective:  Physical Exam: warm, good capillary refill, no trophic changes or ulcerative lesions, normal DP and PT pulses, and normal sensory exam. Left Foot: normal exam, no swelling, tenderness, instability; ligaments intact, full range of motion of all ankle/foot joints Right Foot: Pain and swelling around the fifth toe and dorsal lateral foot with ecchymosis no obvious deformity clinically   Radiographs: Multiple views x-ray of the right foot: Displaced spiral diaphyseal fracture of the proximal phalanx of the fifth toe, post reduction films show minimal change in alignment Assessment:   1. Closed fracture of phalanx of left fifth toe, initial encounter      Plan:  Patient was evaluated and treated and all questions answered.  Reviewed treatments of toe fractures including buddy splinting and using a surgical shoe.  Following a proximal field block with 6 cc of lidocaine 2% plain I attempted a closed reduction of the fracture and splinted it using Coban and cast padding.  Postreduction films showed minimal change in alignment.  Discussed with her this will likely take several months to heal.  I will see her back in 6 weeks for new x-rays.  Hopefully would not be symptomatic she has no obvious clinical deformity, looking at her left foot I think she likely had some adductovarus component of the fifth toe which has been straightened out with the right fifth toe fracture  Return in about 6 weeks (around 03/29/2021) for re-check toe fracture with new x-rays .

## 2021-03-23 ENCOUNTER — Ambulatory Visit (INDEPENDENT_AMBULATORY_CARE_PROVIDER_SITE_OTHER): Payer: BC Managed Care – PPO | Admitting: Podiatry

## 2021-03-23 ENCOUNTER — Other Ambulatory Visit: Payer: Self-pay

## 2021-03-23 ENCOUNTER — Ambulatory Visit (INDEPENDENT_AMBULATORY_CARE_PROVIDER_SITE_OTHER): Payer: BC Managed Care – PPO

## 2021-03-23 DIAGNOSIS — S92502D Displaced unspecified fracture of left lesser toe(s), subsequent encounter for fracture with routine healing: Secondary | ICD-10-CM | POA: Diagnosis not present

## 2021-03-23 DIAGNOSIS — S92501D Displaced unspecified fracture of right lesser toe(s), subsequent encounter for fracture with routine healing: Secondary | ICD-10-CM

## 2021-03-23 NOTE — Progress Notes (Signed)
  Subjective:  Patient ID: Michelle Dorsey, female    DOB: March 05, 1971,  MRN: [id number]  Chief Complaint  Patient presents with   Fracture     re-check toe fracture with new x-rays    50 y.o. female returns for follow-up with the above complaint. History confirmed with patient.  She is doing much better it still swollen but not painful now  Objective:  Physical Exam: warm, good capillary refill, no trophic changes or ulcerative lesions, normal DP and PT pulses, and normal sensory exam. Left Foot: normal exam, no swelling, tenderness, instability; ligaments intact, full range of motion of all ankle/foot joints Right Foot: Mild swelling around the fifth toe no pain   Radiographs: Multiple views x-ray of the right foot: New radiographs today show unchanged alignment and bone callus formation Assessment:   1. Closed fracture of phalanx of right fifth toe with routine healing, subsequent encounter      Plan:  Patient was evaluated and treated and all questions answered.  She is doing well the toe is still swollen and I advised her this may take some time to completely resolve she return to see me as needed for this or other issues if it is symptomatic  Return if symptoms worsen or fail to improve.

## 2021-11-01 ENCOUNTER — Other Ambulatory Visit: Payer: Self-pay | Admitting: Obstetrics and Gynecology

## 2021-11-01 DIAGNOSIS — R928 Other abnormal and inconclusive findings on diagnostic imaging of breast: Secondary | ICD-10-CM

## 2021-11-04 ENCOUNTER — Ambulatory Visit
Admission: RE | Admit: 2021-11-04 | Discharge: 2021-11-04 | Disposition: A | Discharge status: Home / Self Care | Payer: BC Managed Care – PPO | Source: Ambulatory Visit | Attending: Obstetrics and Gynecology | Admitting: Obstetrics and Gynecology

## 2021-11-04 DIAGNOSIS — R928 Other abnormal and inconclusive findings on diagnostic imaging of breast: Secondary | ICD-10-CM

## 2021-12-03 ENCOUNTER — Encounter: Payer: Self-pay | Admitting: Gastroenterology

## 2022-02-09 ENCOUNTER — Encounter: Payer: BC Managed Care – PPO | Admitting: Gastroenterology

## 2022-02-23 ENCOUNTER — Ambulatory Visit (AMBULATORY_SURGERY_CENTER): Payer: Self-pay | Admitting: *Deleted

## 2022-02-23 VITALS — Ht 68.75 in | Wt 169.8 lb

## 2022-02-23 DIAGNOSIS — Z1211 Encounter for screening for malignant neoplasm of colon: Secondary | ICD-10-CM

## 2022-02-23 MED ORDER — NA SULFATE-K SULFATE-MG SULF 17.5-3.13-1.6 GM/177ML PO SOLN: 1.0000 | Freq: Once | ORAL | 0 refills | Status: AC

## 2022-02-23 NOTE — Progress Notes (Signed)
No egg or soy allergy known to patient  No issues known to pt with past sedation with any surgeries or procedures Patient denies ever being told they had issues or difficulty with intubation  No FH of Malignant Hyperthermia Pt is not on diet pills Pt is not on  home 02  Pt is not on blood thinners  Pt denies issues with constipation  No A fib or A flutter Have any cardiac testing pending--NO Pt instructed to use Single[employer]com or GoodRx for a price reduction on prep    Pt. Stated " I  have PONV ,after sedation.

## 2022-03-16 ENCOUNTER — Encounter: Payer: BC Managed Care – PPO | Admitting: Gastroenterology

## 2022-03-30 ENCOUNTER — Encounter: Payer: BC Managed Care – PPO | Admitting: Gastroenterology

## 2022-04-14 ENCOUNTER — Encounter: Payer: Self-pay | Admitting: Gastroenterology

## 2022-04-27 ENCOUNTER — Encounter: Payer: Self-pay | Admitting: Gastroenterology

## 2022-04-27 ENCOUNTER — Ambulatory Visit (AMBULATORY_SURGERY_CENTER): Payer: BC Managed Care – PPO | Admitting: Gastroenterology

## 2022-04-27 VITALS — BP 148/90 | HR 54 | Temp 96.8°F | Resp 9 | Ht 68.0 in | Wt 169.8 lb

## 2022-04-27 DIAGNOSIS — Z1211 Encounter for screening for malignant neoplasm of colon: Secondary | ICD-10-CM | POA: Diagnosis not present

## 2022-04-27 DIAGNOSIS — R1030 Lower abdominal pain, unspecified: Secondary | ICD-10-CM

## 2022-04-27 MED ORDER — DICYCLOMINE HCL 10 MG PO CAPS: 10.0000 mg | ORAL_CAPSULE | Freq: Three times a day (TID) | ORAL | 0 refills | Status: AC

## 2022-04-27 MED ORDER — SODIUM CHLORIDE 0.9 % IV SOLN: 500.0000 mL | Freq: Once | INTRAVENOUS | Status: DC

## 2022-04-27 NOTE — Progress Notes (Signed)
A and O x3. Report to RN. Tolerated MAC anesthesia well. 

## 2022-04-27 NOTE — Op Note (Addendum)
[facility] Patient Name: Michelle Dorsey Procedure Date: 04/27/2022 10:13 AM MRN: [id number] Endoscopist: Justice Britain , MD Age: 51 Referring MD:  Date of Birth: 1971-04-20 Gender: Female Account #: [id number] Procedure:                Colonoscopy Indications:              Screening for colorectal malignant neoplasm Medicines:                Monitored Anesthesia Care Procedure:                Pre-Anesthesia Assessment:                           - Prior to the procedure, a History and Physical                            was performed, and patient medications and                            allergies were reviewed. The patient's tolerance of                            previous anesthesia was also reviewed. The risks                            and benefits of the procedure and the sedation                            options and risks were discussed with the patient.                            All questions were answered, and informed consent                            was obtained. Prior Anticoagulants: The patient has                            taken no previous anticoagulant or antiplatelet                            agents. ASA Grade Assessment: II - A patient with                            mild systemic disease. After reviewing the risks                            and benefits, the patient was deemed in                            satisfactory condition to undergo the procedure.                           After obtaining informed consent, the colonoscope  was passed under direct vision. Throughout the                            procedure, the patient's blood pressure, pulse, and                            oxygen saturations were monitored continuously. The                            Olympus CF-HQ190L ([id number]) Colonoscope was                            introduced through the anus and advanced to the the                            cecum,  identified by appendiceal orifice and                            ileocecal valve. The colonoscopy was unusually                            difficult due to restricted mobility of the colon,                            a redundant colon, significant looping and a                            tortuous colon. Successful completion of the                            procedure was aided by changing the patient to a                            supine position, using manual pressure,                            straightening and shortening the scope to obtain                            bowel loop reduction and using scope torsion. The                            quality of the bowel preparation was adequate. The                            ileocecal valve, appendiceal orifice, and rectum                            were photographed. Scope In: 10:34:45 AM Scope Out: 11:04:55 AM Scope Withdrawal Time: 0 hours 15 minutes 29 seconds  Total Procedure Duration: 0 hours 30 minutes 10 seconds  Findings:                 The digital rectal exam findings include  hemorrhoids. Pertinent negatives include no                            palpable rectal lesions.                           A moderate amount of semi-liquid stool and residue                            was found in the entire colon, interfering with                            visualization. Lavage of the area was performed                            using copious amounts, resulting in clearance with                            adequate visualization.                           Multiple small-mouthed diverticula were found in                            the recto-sigmoid colon and sigmoid colon.                           Normal mucosa was found in the entire colon                            otherwise.                           Non-bleeding non-thrombosed internal hemorrhoids                            were found during perianal exam,  during digital                            exam and during endoscopy. Due to a very small                            rectum, retroflexion was not able to be performed.                            The hemorrhoids were Grade II (internal hemorrhoids                            that prolapse but reduce spontaneously). Complications:            No immediate complications. Estimated Blood Loss:     Estimated blood loss: none. Impression:               - Hemorrhoids found on digital rectal exam.                           -  Stool and residue in the entire examined colon -                            adequately lavaged.                           - Diverticulosis in the recto-sigmoid colon and in                            the sigmoid colon.                           - Normal mucosa in the entire examined colon                            otherwise.                           - Non-bleeding non-thrombosed internal hemorrhoids. Recommendation:           - The patient will be observed post-procedure,                            until all discharge criteria are met.                           - Discharge patient to home.                           - Patient has a contact number available for                            emergencies. The signs and symptoms of potential                            delayed complications were discussed with the                            patient. Return to normal activities tomorrow.                            Written discharge instructions were provided to the                            patient.                           - High fiber diet.                           - Dicyclomine 10 mg 3 times daily as needed                            abdominal cramping/pain in setting of previous                            history of  abdominal migraines.                           - Continue present medications.                           - Repeat colonoscopy in 10 years for screening                             purposes. Recommend daily MiraLAX 1 week before                            preparation.                           - The findings and recommendations were discussed                            with the patient.                           - The findings and recommendations were discussed                            with the patient's family. Justice Britain, MD 04/27/2022 11:12:06 AM

## 2022-04-27 NOTE — Patient Instructions (Signed)
Discharge instructions given. Handouts on Diverticulosis and Hemorrhoids. Resume previous medications. YOU HAD AN ENDOSCOPIC PROCEDURE TODAY AT THE [facility]:   Refer to the procedure report that was given to you for any specific questions about what was found during the examination.  If the procedure report does not answer your questions, please call your gastroenterologist to clarify.  If you requested that your care partner not be given the details of your procedure findings, then the procedure report has been included in a sealed envelope for you to review at your convenience later.  YOU SHOULD EXPECT: Some feelings of bloating in the abdomen. Passage of more gas than usual.  Walking can help get rid of the air that was put into your GI tract during the procedure and reduce the bloating. If you had a lower endoscopy (such as a colonoscopy or flexible sigmoidoscopy) you may notice spotting of blood in your stool or on the toilet paper. If you underwent a bowel prep for your procedure, you may not have a normal bowel movement for a few days.  Please Note:  You might notice some irritation and congestion in your nose or some drainage.  This is from the oxygen used during your procedure.  There is no need for concern and it should clear up in a day or so.  SYMPTOMS TO REPORT IMMEDIATELY:  Following lower endoscopy (colonoscopy or flexible sigmoidoscopy):  Excessive amounts of blood in the stool  Significant tenderness or worsening of abdominal pains  Swelling of the abdomen that is new, acute  Fever of 100F or higher  For urgent or emergent issues, a gastroenterologist can be reached at any hour by calling [phone]. Do not use MyChart messaging for urgent concerns.    DIET:  We do recommend a small meal at first, but then you may proceed to your regular diet.  Drink plenty of fluids but you should avoid alcoholic beverages for 24 hours.  ACTIVITY:  You should plan to take  it easy for the rest of today and you should NOT DRIVE or use heavy machinery until tomorrow (because of the sedation medicines used during the test).    FOLLOW UP: Our staff will call the number listed on your records the next business day following your procedure.  We will call around 7:15- 8:00 am to check on you and address any questions or concerns that you may have regarding the information given to you following your procedure. If we do not reach you, we will leave a message.     If any biopsies were taken you will be contacted by phone or by letter within the next 1-3 weeks.  Please call us at [phone] if you have not heard about the biopsies in 3 weeks.    SIGNATURES/CONFIDENTIALITY: You and/or your care partner have signed paperwork which will be entered into your electronic medical record.  These signatures attest to the fact that that the information above on your After Visit Summary has been reviewed and is understood.  Full responsibility of the confidentiality of this discharge information lies with you and/or your care-partner.  

## 2022-04-27 NOTE — Progress Notes (Signed)
GASTROENTEROLOGY PROCEDURE H&P NOTE   Primary Care Physician: Patient, No Pcp Per  HPI: Michelle Dorsey is a 51 y.o. female who presents for colonoscopy for screening.  Past Medical History:  Diagnosis Date   Abdominal pain    Allergy    SEASONAL   Anemia    Asthma    Past Surgical History:  Procedure Laterality Date   ABDOMINAL HYSTERECTOMY     SHOULDER SURGERY     TUBAL LIGATION     Current Outpatient Medications  Medication Sig Dispense Refill   methocarbamol (ROBAXIN) 750 MG tablet Take 750 mg by mouth 3 (three) times daily as needed.     No current facility-administered medications for this visit.    Current Outpatient Medications:    methocarbamol (ROBAXIN) 750 MG tablet, Take 750 mg by mouth 3 (three) times daily as needed., Disp: , Rfl:  Allergies  Allergen Reactions   Penicillins     rash   Sulfa Antibiotics    Family History  Problem Relation Age of Onset   Heart disease Mother    Recurrent abdominal pain Mother    Cancer Maternal Grandfather        bladder   Cancer Paternal Grandfather        Lung   Crohn's disease Daughter    Crohn's disease Son    Colon cancer Neg Hx    Colon polyps Neg Hx    Esophageal cancer Neg Hx    Stomach cancer Neg Hx    Rectal cancer Neg Hx    Social History   Socioeconomic History   Marital status: Married    Spouse name: Not on file   Number of children: 2   Years of education: Not on file   Highest education level: Not on file  Occupational History   Not on file  Tobacco Use   Smoking status: Former    Types: Cigarettes    Quit date: 2014    Years since quitting: 9.7    Passive exposure: Never   Smokeless tobacco: Never  Vaping Use   Vaping Use: Never used  Substance and Sexual Activity   Alcohol use: No   Drug use: No   Sexual activity: Not on file  Other Topics Concern   Not on file  Social History Narrative   Not on file   Social Determinants of Health   Financial Resource Strain: Not on  file  Food Insecurity: Not on file  Transportation Needs: Not on file  Physical Activity: Not on file  Stress: Not on file  Social Connections: Not on file  Intimate Partner Violence: Not on file    Physical Exam: There were no vitals filed for this visit. There is no height or weight on file to calculate BMI. GEN: NAD EYE: Sclerae anicteric ENT: MMM CV: Non-tachycardic GI: Soft, NT/ND NEURO:  Alert & Oriented x 3  Lab Results: No results for input(s): "WBC", "HGB", "HCT", "PLT" in the last 72 hours. BMET No results for input(s): "NA", "K", "CL", "CO2", "GLUCOSE", "BUN", "CREATININE", "CALCIUM" in the last 72 hours. LFT No results for input(s): "PROT", "ALBUMIN", "AST", "ALT", "ALKPHOS", "BILITOT", "BILIDIR", "IBILI" in the last 72 hours. PT/INR No results for input(s): "LABPROT", "INR" in the last 72 hours.   Impression / Plan: This is a 51 y.o.female who presents for colonoscopy for screening.  The risks and benefits of endoscopic evaluation/treatment were discussed with the patient and/or family; these include but are not limited to the risk of  perforation, infection, bleeding, missed lesions, lack of diagnosis, severe illness requiring hospitalization, as well as anesthesia and sedation related illnesses.  The patient's history has been reviewed, patient examined, no change in status, and deemed stable for procedure.  The patient and/or family is agreeable to proceed.    Justice Britain, MD [facility] Office # [phone]

## 2022-04-28 ENCOUNTER — Telehealth: Payer: Self-pay

## 2022-04-28 NOTE — Telephone Encounter (Signed)
  Follow up Call-     04/27/2022   10:13 AM  Call back number  Post procedure Call Back phone  # [phone]  Permission to leave phone message Yes     Patient questions:  Do you have a fever, pain , or abdominal swelling? No. Pain Score  0 *  Have you tolerated food without any problems? Yes.    Have you been able to return to your normal activities? No.  Do you have any questions about your discharge instructions: Diet   No. Medications  No. Follow up visit  No.  Do you have questions or concerns about your Care? No.  Actions: * If pain score is 4 or above: No action needed, pain <4.

## 2022-10-01 IMAGING — US US BREAST*R* LIMITED INC AXILLA
1 series · 7 of 7 positions shown · non-contrast
Comparison: Previous exam(s).

CLINICAL DATA: Small area of calcifications in a possible mass in
the medial right breast on a recent screening mammogram.

EXAM:
DIGITAL DIAGNOSTIC UNILATERAL RIGHT MAMMOGRAM WITH TOMOSYNTHESIS AND
CAD; ULTRASOUND RIGHT BREAST LIMITED
TECHNIQUE: Right digital diagnostic mammography and breast tomosynthesis was
performed. The images were evaluated with computer-aided detection.;
Targeted ultrasound examination of the right breast was performed

[Series 1: us breast*right* limited inc axilla · 0.07mm/px · 7 of 7 slices shown]
[im 1/7]
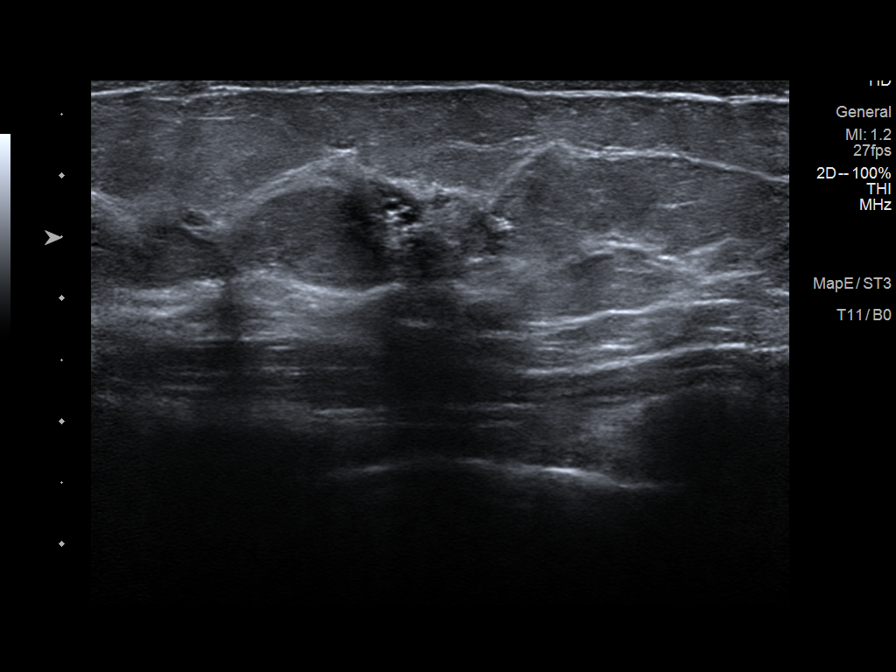
[im 2/7]
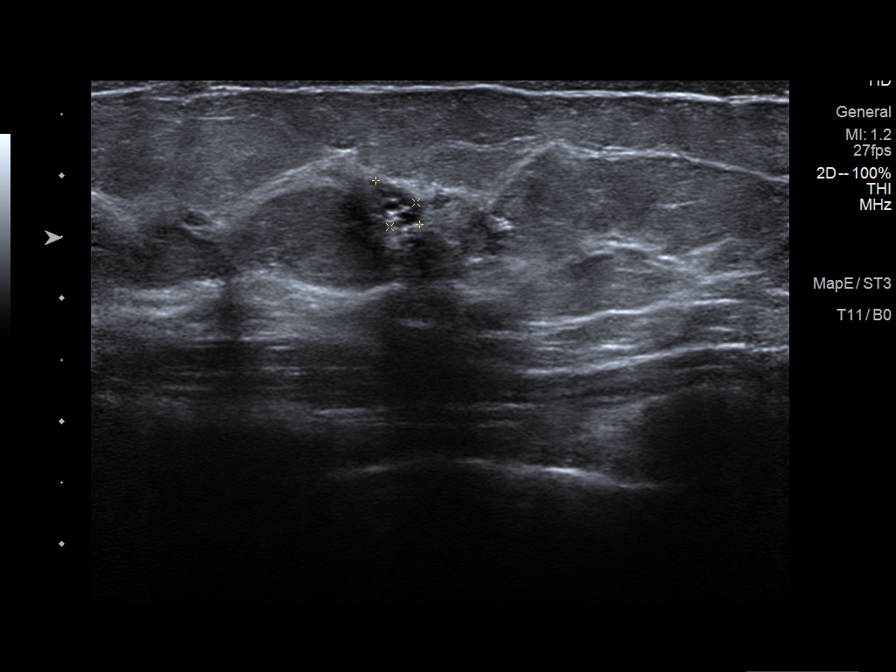
[im 3/7]
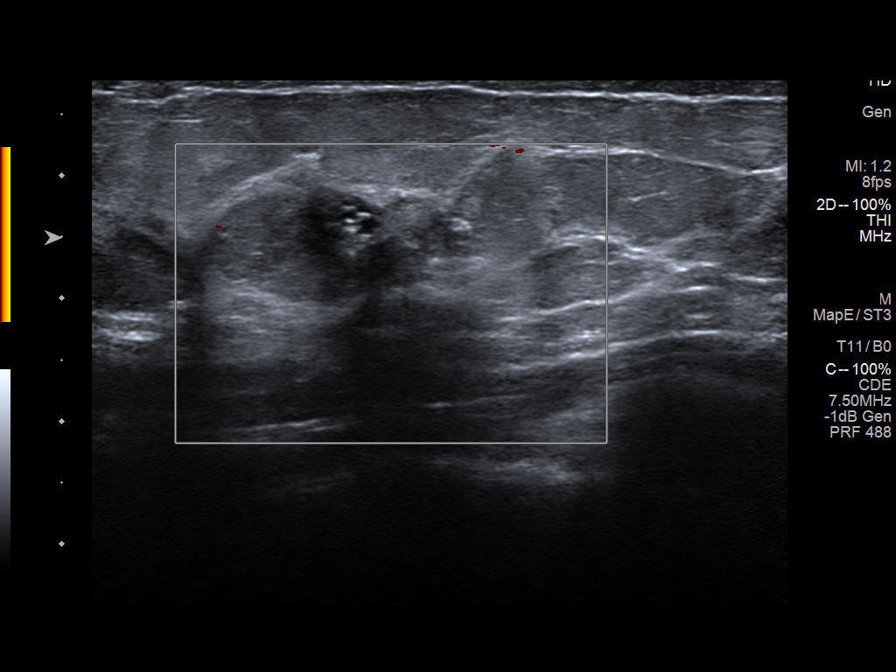
[im 4/7]
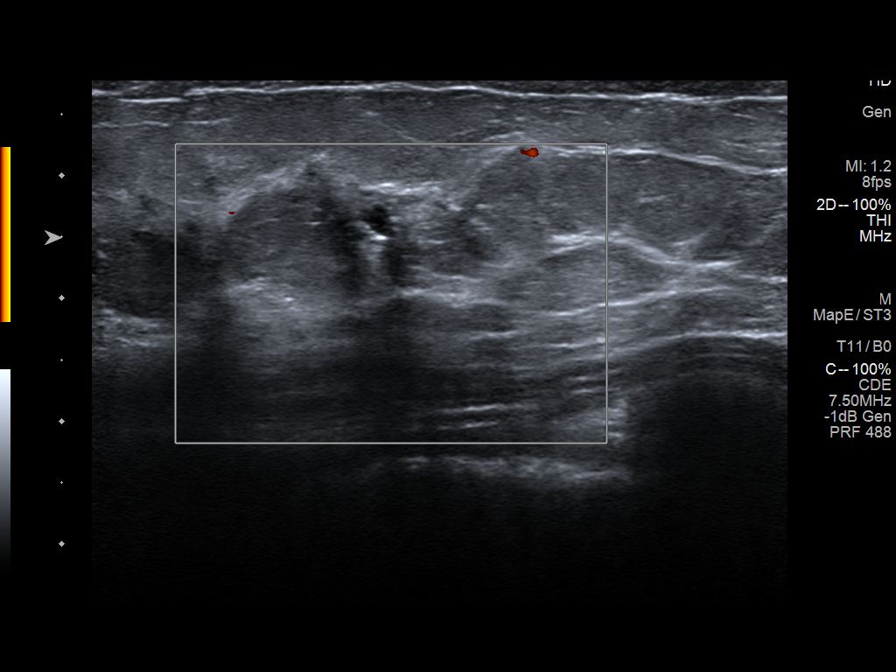
[im 5/7]
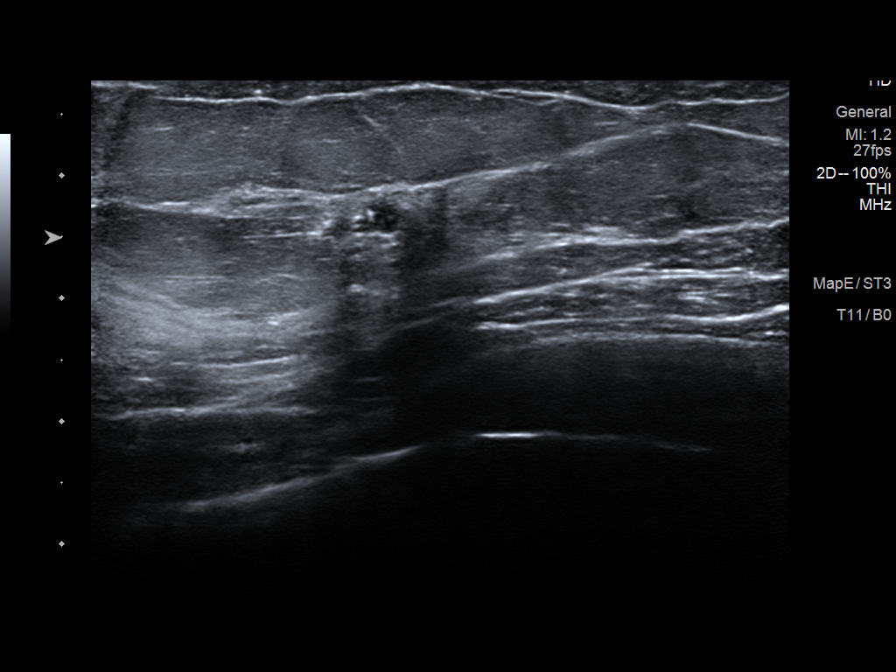
[im 6/7]
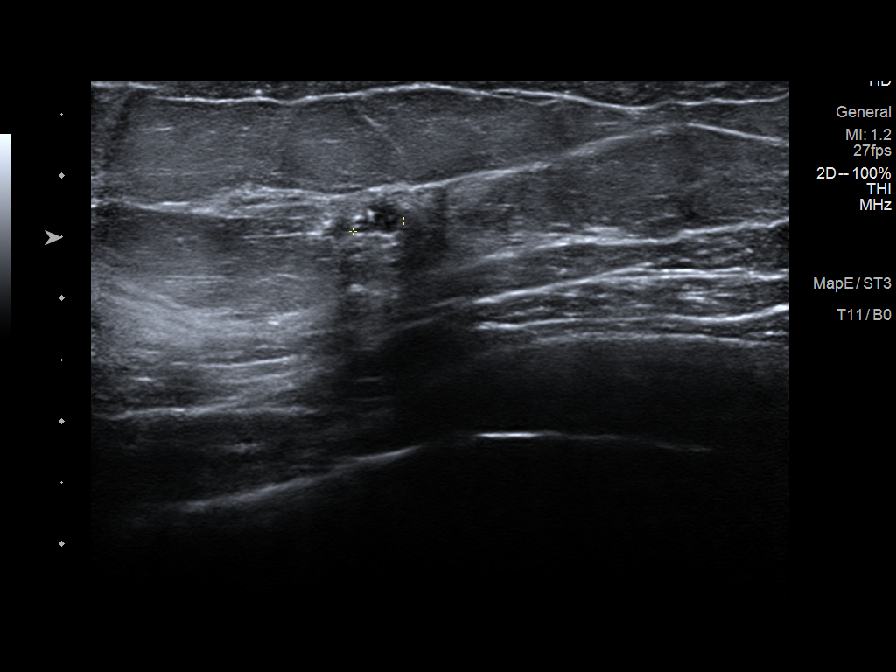
[im 7/7]
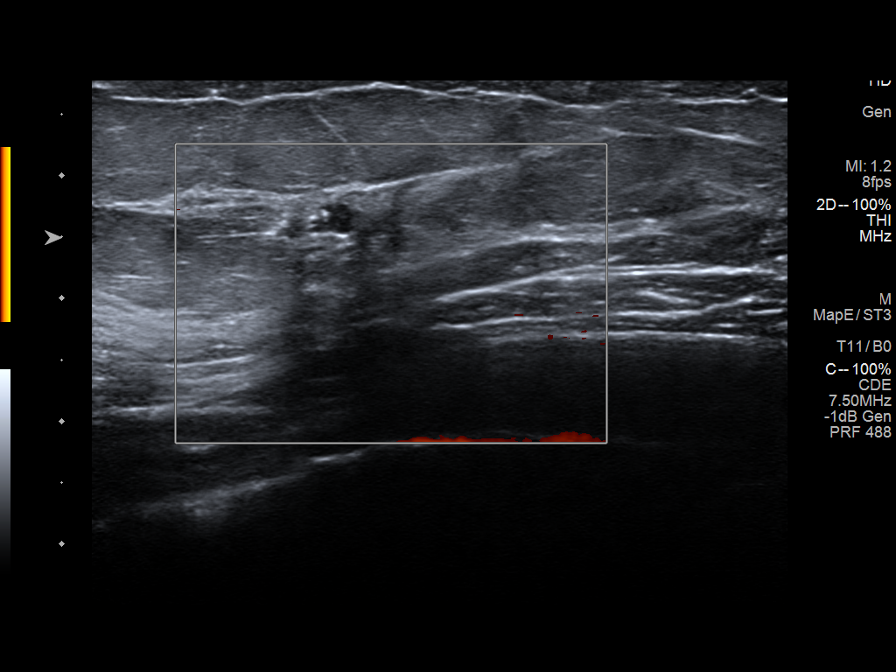

[7 of 7 positions shown; findings below may reference images not displayed]

ACR Breast Density Category c: The breast tissue is heterogeneously
dense, which may obscure small masses.
FINDINGS: 3D tomographic and 2D generated spot compression images of the right
breast were obtained as well as 2D true lateral and spot
magnification images. These confirm a small, mildly irregular
mass-like density containing a few punctate, rounded and oval
calcifications in the medial aspect of the breast. One of the
calcifications demonstrates dependent layering.

On physical exam, no mass is palpable in the medial right breast.

Targeted ultrasound is performed, showing a 5 x 4 x 3 mm cyst
containing multiple small calcifications in the 2 o'clock position
of the right breast, 3 cm from the nipple. The calcifications
demonstrate dependent layering. No internal blood flow was seen with
power Doppler.
IMPRESSION: 5 mm benign cyst with milk of calcium in the 2 o'clock position of
the right breast. No evidence of malignancy.

RECOMMENDATION:
Bilateral screening mammogram in 1 year when due.

I have discussed the findings and recommendations with the patient.
If applicable, a reminder letter will be sent to the patient
regarding the next appointment.

BI-RADS CATEGORY  2: Benign.

## 2022-10-01 IMAGING — MG MM DIGITAL DIAGNOSTIC UNILAT*R* W/ TOMO W/ CAD
8 series · 8 of 16 positions shown · non-contrast
Comparison: Previous exam(s).

CLINICAL DATA: Small area of calcifications in a possible mass in
the medial right breast on a recent screening mammogram.

EXAM:
DIGITAL DIAGNOSTIC UNILATERAL RIGHT MAMMOGRAM WITH TOMOSYNTHESIS AND
CAD; ULTRASOUND RIGHT BREAST LIMITED
TECHNIQUE: Right digital diagnostic mammography and breast tomosynthesis was
performed. The images were evaluated with computer-aided detection.;
Targeted ultrasound examination of the right breast was performed

[R ML (1 of 3)]
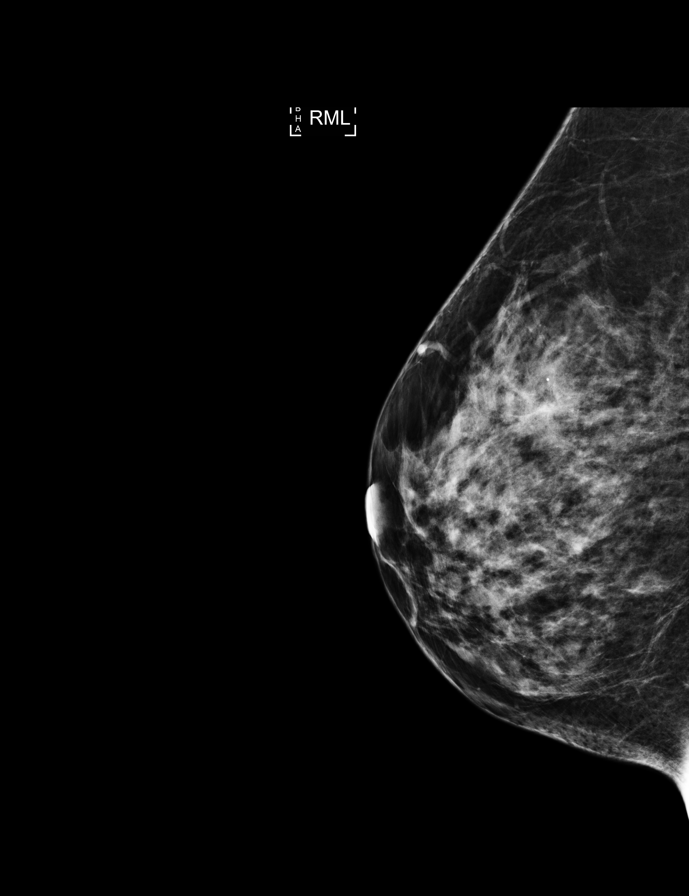

[R ML (2 of 3)]
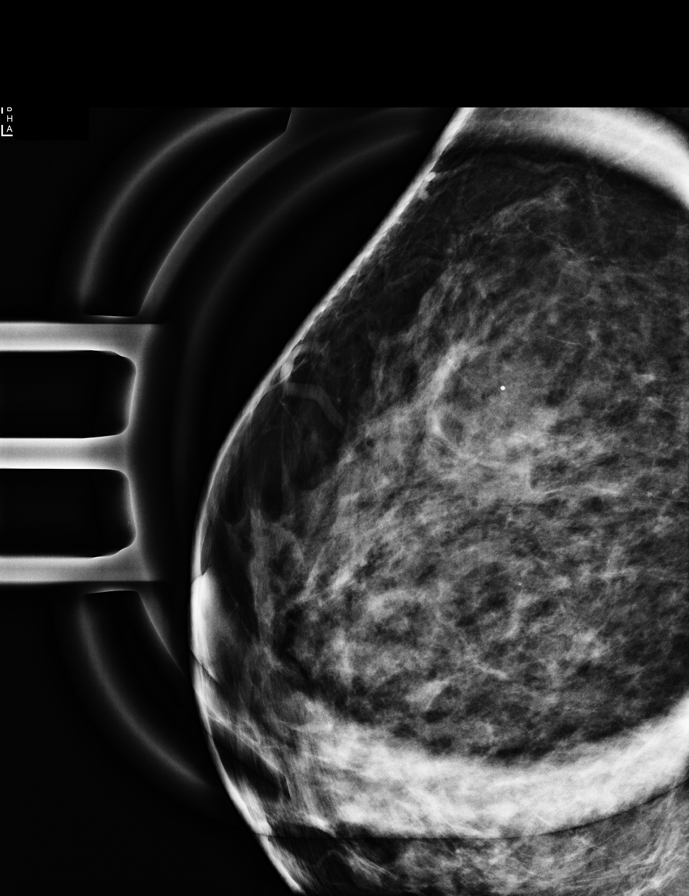

[R CC]
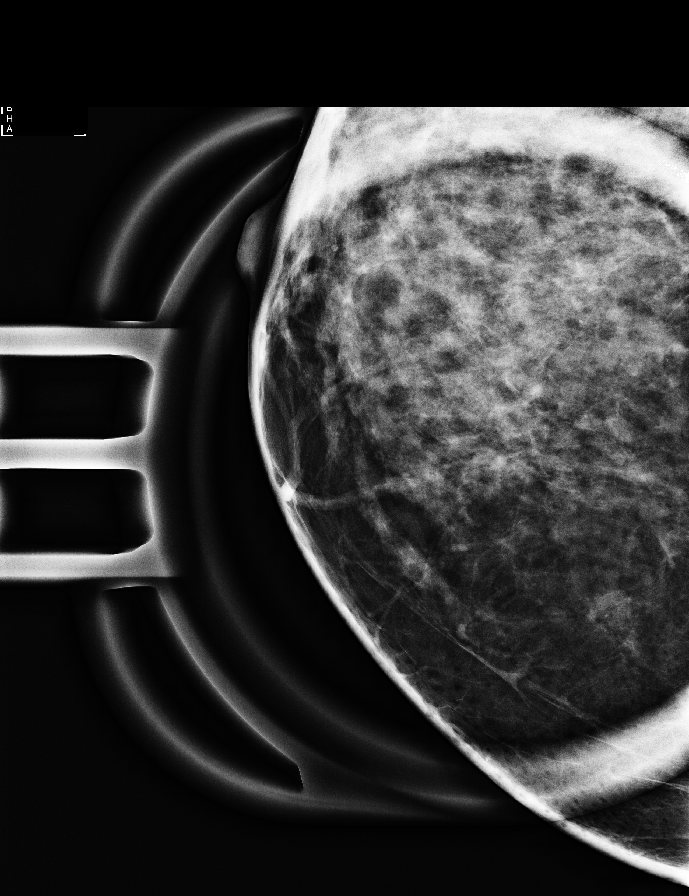

[R ML (3 of 3)]
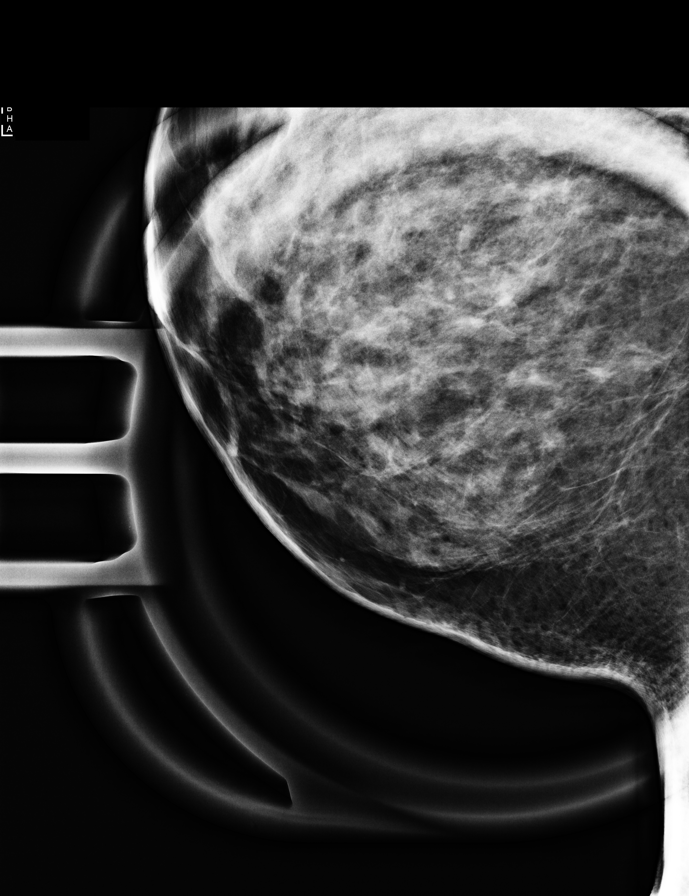

[R MLO synth-2D]
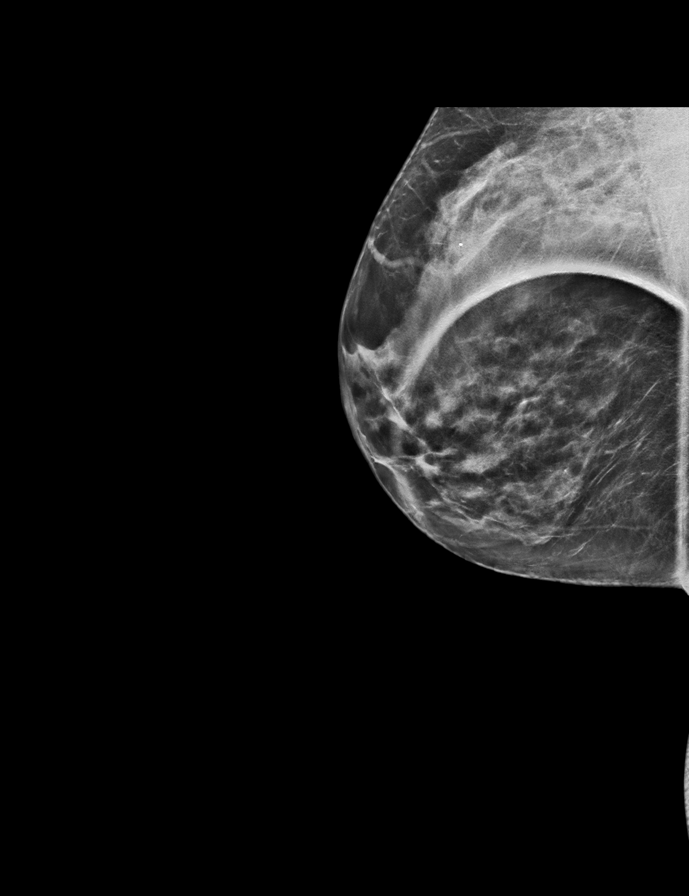

[R CC synth-2D]
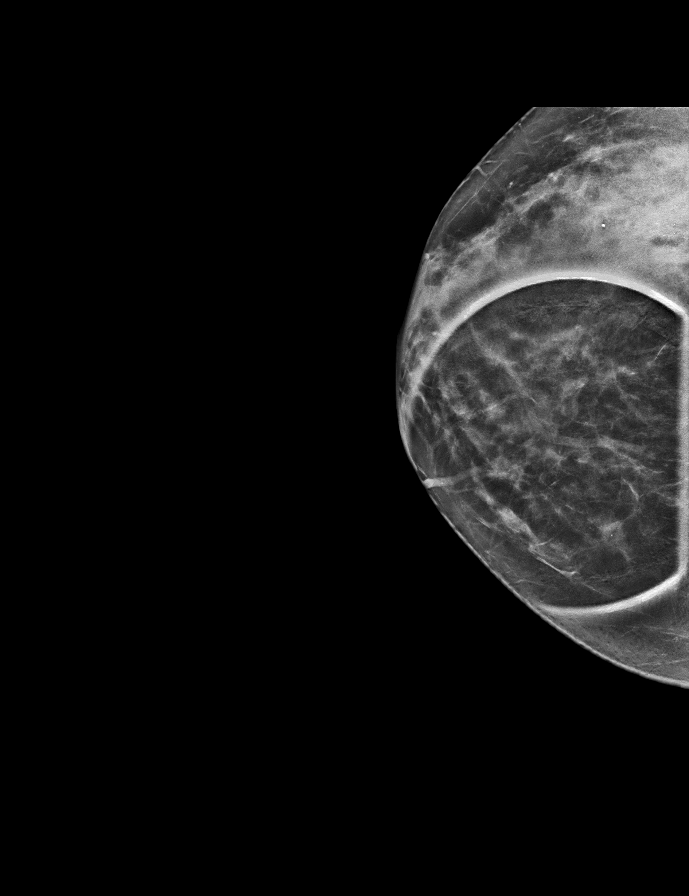

[R CC tomo · tomo slice 29/56.0]
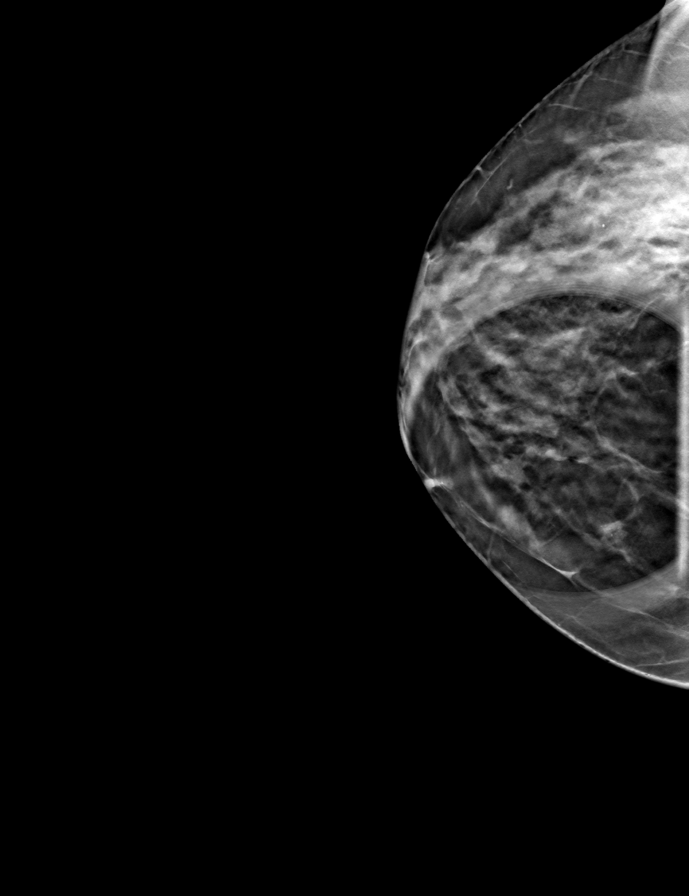

[R MLO tomo · tomo slice 34/67.0]
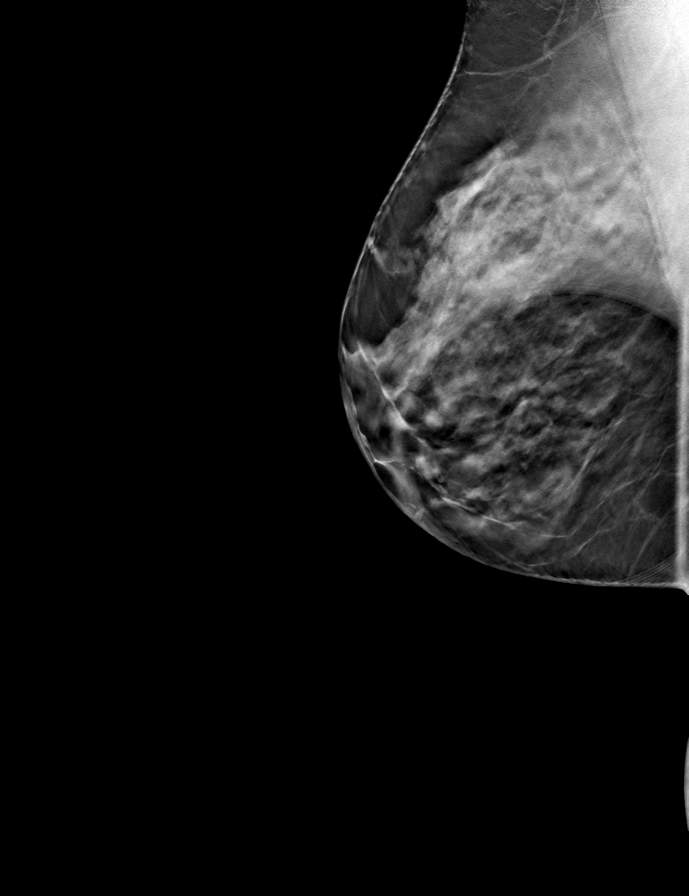

[8 of 16 positions shown; findings below may reference images not displayed]

ACR Breast Density Category c: The breast tissue is heterogeneously
dense, which may obscure small masses.
FINDINGS: 3D tomographic and 2D generated spot compression images of the right
breast were obtained as well as 2D true lateral and spot
magnification images. These confirm a small, mildly irregular
mass-like density containing a few punctate, rounded and oval
calcifications in the medial aspect of the breast. One of the
calcifications demonstrates dependent layering.

On physical exam, no mass is palpable in the medial right breast.

Targeted ultrasound is performed, showing a 5 x 4 x 3 mm cyst
containing multiple small calcifications in the 2 o'clock position
of the right breast, 3 cm from the nipple. The calcifications
demonstrate dependent layering. No internal blood flow was seen with
power Doppler.
IMPRESSION: 5 mm benign cyst with milk of calcium in the 2 o'clock position of
the right breast. No evidence of malignancy.

RECOMMENDATION:
Bilateral screening mammogram in 1 year when due.

I have discussed the findings and recommendations with the patient.
If applicable, a reminder letter will be sent to the patient
regarding the next appointment.

BI-RADS CATEGORY  2: Benign.

## 2022-12-23 ENCOUNTER — Other Ambulatory Visit: Payer: Self-pay | Admitting: Podiatry

## 2022-12-23 ENCOUNTER — Ambulatory Visit (INDEPENDENT_AMBULATORY_CARE_PROVIDER_SITE_OTHER): Payer: BC Managed Care – PPO

## 2022-12-23 ENCOUNTER — Encounter: Payer: Self-pay | Admitting: Podiatry

## 2022-12-23 ENCOUNTER — Ambulatory Visit: Payer: BC Managed Care – PPO | Admitting: Podiatry

## 2022-12-23 DIAGNOSIS — M722 Plantar fascial fibromatosis: Secondary | ICD-10-CM

## 2022-12-23 MED ORDER — DICLOFENAC SODIUM 75 MG PO TBEC
75.0000 mg | DELAYED_RELEASE_TABLET | Freq: Two times a day (BID) | ORAL | 2 refills | Status: DC
Start: 2022-12-23 — End: 2023-02-24

## 2022-12-23 MED ORDER — TRIAMCINOLONE ACETONIDE 10 MG/ML IJ SUSP
10.0000 mg | Freq: Once | INTRAMUSCULAR | Status: AC
Start: 2022-12-23 — End: 2022-12-23
  Administered 2022-12-23: 10 mg

## 2022-12-23 NOTE — Patient Instructions (Signed)

## 2022-12-25 NOTE — Progress Notes (Signed)
Subjective:   Patient ID: Michelle Dorsey, female   DOB: 52 y.o.   MRN: [id number]   HPI Patient presents with a lot of pain in the arch left burning sensation has been going on for few months.  Worse in the morning history of Planter fasciitis this feels different has tried over-the-counter insoles new shoes and patient does not smoke and would like to be active   Review of Systems  All other systems reviewed and are negative.       Objective:  Physical Exam Vitals and nursing note reviewed.  Constitutional:      Appearance: She is well-developed.  Pulmonary:     Effort: Pulmonary effort is normal.  Musculoskeletal:        General: Normal range of motion.  Skin:    General: Skin is warm.  Neurological:     Mental Status: She is alert.     Neurovascular status intact muscle strength adequate range of motion within normal limits with exquisite discomfort in the mid arch area left with fluid buildup noted within the mid arch and very painful when pressed.  Good digital perfusion well-oriented     Assessment:  Acute mid arch fasciitis left with inflammation fluid noted and pain     Plan:  H&P reviewed discussed condition and x-rays at this time went ahead did sterile prep injected the mid arch left 3 mg Kenalog 5 mg Xylocaine instructed on anti-inflammatories and due to the intensity of the discomfort and also other pains involved I did apply air fracture walker to completely immobilize the lower leg fitted properly to her left lower leg all instructions given  X-rays indicate moderate spur formation no indication stress fracture arthritis

## 2022-12-26 ENCOUNTER — Telehealth: Payer: Self-pay | Admitting: Podiatry

## 2022-12-26 NOTE — Telephone Encounter (Signed)
Pt called and left message on 6.7 at 211pm stating she was in and got a shot in her arch today and her whole foot is numb and tingling and she was not sure if this was to have happened. She said it has lasted several hours and wanted to make sure this is normal as she was not told to expect this. Please advise

## 2022-12-26 NOTE — Telephone Encounter (Signed)
Called pt and she said that it was better and that the feeling did come back after a few hours. Foot is feeling better. Will call if any other issues.

## 2022-12-26 NOTE — Telephone Encounter (Signed)
Can happen. Should go away within a few hours

## 2023-01-27 ENCOUNTER — Ambulatory Visit: Payer: BC Managed Care – PPO | Admitting: Podiatry

## 2023-02-24 ENCOUNTER — Other Ambulatory Visit: Payer: Self-pay | Admitting: Podiatry

## 2023-02-27 ENCOUNTER — Ambulatory Visit: Payer: BC Managed Care – PPO | Admitting: Podiatry

## 2023-07-26 ENCOUNTER — Ambulatory Visit: Payer: BC Managed Care – PPO | Admitting: Podiatry

## 2023-07-26 ENCOUNTER — Ambulatory Visit (INDEPENDENT_AMBULATORY_CARE_PROVIDER_SITE_OTHER): Payer: BC Managed Care – PPO

## 2023-07-26 DIAGNOSIS — M7672 Peroneal tendinitis, left leg: Secondary | ICD-10-CM | POA: Diagnosis not present

## 2023-07-26 DIAGNOSIS — M778 Other enthesopathies, not elsewhere classified: Secondary | ICD-10-CM

## 2023-07-26 DIAGNOSIS — M7752 Other enthesopathy of left foot: Secondary | ICD-10-CM

## 2023-07-26 DIAGNOSIS — M7751 Other enthesopathy of right foot: Secondary | ICD-10-CM

## 2023-07-26 MED ORDER — TRIAMCINOLONE ACETONIDE 10 MG/ML IJ SUSP
10.0000 mg | Freq: Once | INTRAMUSCULAR | Status: AC
Start: 2023-07-26 — End: 2023-07-26
  Administered 2023-07-26: 10 mg via INTRA_ARTICULAR

## 2023-07-26 MED ORDER — METHYLPREDNISOLONE 4 MG PO TBPK: ORAL_TABLET | ORAL | 0 refills | Status: AC

## 2023-07-30 NOTE — Progress Notes (Signed)
 Subjective:   Patient ID: Michelle Dorsey, female   DOB: 53 y.o.   MRN: [id number]   HPI Patient presents stating that she is having a lot of pain still in her right ankle and then on the outside of the left foot along with the left ankle to a degree.  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  For the right I went ahead and I did do sterile prep injected the sinus tarsi 3 mg Kenalog  5 mg Xylocaine and did sterile bandage.  For the left I discussed the peroneal insertion explained risk I did sterile prep injected the sheath of the tendon 3 mg Dexasone Kenalog  5 mg Xylocaine advised on reduced activity and will be seen back to reevaluate.  I did also today dispensed compression stockings to try to help with the chronic local edema she experiences  X-rays indicate there is moderate depression of the arch no increase in arthritic processes no indication of stress fracture or pathology base of fifth metatarsal left

## 2023-08-09 ENCOUNTER — Encounter: Payer: Self-pay | Admitting: Podiatry

## 2023-08-09 ENCOUNTER — Ambulatory Visit (INDEPENDENT_AMBULATORY_CARE_PROVIDER_SITE_OTHER): Payer: BC Managed Care – PPO | Admitting: Podiatry

## 2023-08-09 DIAGNOSIS — M7672 Peroneal tendinitis, left leg: Secondary | ICD-10-CM

## 2023-08-09 DIAGNOSIS — L923 Foreign body granuloma of the skin and subcutaneous tissue: Secondary | ICD-10-CM | POA: Diagnosis not present

## 2023-08-09 MED ORDER — TRIAMCINOLONE ACETONIDE 10 MG/ML IJ SUSP
10.0000 mg | Freq: Once | INTRAMUSCULAR | Status: AC
Start: 2023-08-09 — End: 2023-08-09
  Administered 2023-08-09: 10 mg via INTRA_ARTICULAR

## 2023-08-10 NOTE — Progress Notes (Signed)
Subjective:   Patient ID: Michelle Dorsey, female   DOB: 53 y.o.   MRN: [id number]   HPI Patient presents stating that the left foot is bothering her quite a bit and it hurts different from where we put the medication in.  Patient states the right foot is feeling some better and she has been wearing her compression stockings.  Also thinks she stepped on something on her right foot and feels like she has a piece of glass   ROS      Objective:  Physical Exam  Neurovascular status intact inflammation of the more distal peroneal and into the fifth metatarsal with fluid buildup left with continued chronic edema which is most likely lymphatic and has a small portal on the lateral plantar aspect right foot painful when pressed     Assessment:  2 separate problems with 1 being continued inflammation left with chronic swelling and #2 possibility of foreign body right with a small piece of glass that she may have stepped on several weeks ago     Plan:  H&P reviewed both conditions and for the left I went ahead sterile prep to distal tendon injection 3 mg dexamethasone Kenalog 5 mg Xylocaine and then for the plantar right I did do sharp sterile debridement of the area with exploration and I feel like it may have been some skin cells that were pushed deeper into the tissue but I did not see a piece of glass and probed the area.  Patient will be seen back as symptoms indicate
